# Patient Record
Sex: Male | Born: 1980 | Race: White | Hispanic: No | Marital: Married | Smoking: Never smoker
Health system: Southern US, Community
[De-identification: ages and names within clinical notes are randomized; demographics above are authoritative.]

---

## 2008-12-16 ENCOUNTER — Encounter: Admission: RE | Admit: 2008-12-16 | Discharge: 2008-12-16 | Payer: Self-pay | Admitting: Occupational Medicine

## 2009-05-11 ENCOUNTER — Emergency Department (HOSPITAL_COMMUNITY): Admission: EM | Admit: 2009-05-11 | Discharge: 2009-05-11 | Payer: Self-pay | Admitting: Emergency Medicine

## 2009-05-14 ENCOUNTER — Ambulatory Visit: Payer: Self-pay | Admitting: Family Medicine

## 2009-05-14 ENCOUNTER — Encounter: Payer: Self-pay | Admitting: Sports Medicine

## 2009-05-14 DIAGNOSIS — S43006A Unspecified dislocation of unspecified shoulder joint, initial encounter: Secondary | ICD-10-CM | POA: Insufficient documentation

## 2009-05-20 ENCOUNTER — Encounter: Admission: RE | Admit: 2009-05-20 | Discharge: 2009-08-16 | Payer: Self-pay | Admitting: Family Medicine

## 2009-12-06 ENCOUNTER — Ambulatory Visit: Payer: Self-pay | Admitting: Occupational Medicine

## 2010-10-04 NOTE — Assessment & Plan Note (Signed)
Summary: HIGH BLOOD PRESSURE?  History of Present Illness History of Present Illness: Very pleasant 30 YO WM.  Presents with complaints of HTN.   Says he has been measuring his BP at home and has had high readings for the lat 6-10 months.   Denies any chest pain, vision changes.  Says that occasionally he "does not feel right" and has some pain in his occipital region.   Blood pressure at a friend's hous one time was 190/??.   In talking with him, it sounds like the BP was taken with a cuff that was too small.   BP in the office today was 131/84 using a large adult cuff.  Repeat BP was 140/87.   Victor Lee does admit to snoring loud on occasion and had a neck circumderence of 17.5 inches.   Denies daytiem sleepiness.  Sleeps 6 hours per night.    Physical Exam Chest/Lungs: no rales, wheezes, or rhonchi bilateral, breath sounds equal without effort Heart: regular rate and  rhythm, no murmur Assessment New Problems: HYPERTENSION (ICD-401.1)   Plan New Orders: No Charge Patient Arrived (NCPA0) [NCPA0] Planning Comments:   Discussed monitoring BP with appropriate sized cuff Discussed ris of sleep apnea and the association with HTN He will follow up with Dr. Linford Arnold as needed.  I do not recommend HTn MEDICAIONS at this time.    The patient and/or caregiver has been counseled thoroughly with regard to medications prescribed including dosage, schedule, interactions, rationale for use, and possible side effects and they verbalize understanding.  Diagnoses and expected course of recovery discussed and will return if not improved as expected or if the condition worsens. Patient and/or caregiver verbalized understanding.

## 2011-11-01 ENCOUNTER — Emergency Department (HOSPITAL_COMMUNITY)
Admission: EM | Admit: 2011-11-01 | Discharge: 2011-11-01 | Disposition: A | Discharge status: Home / Self Care | Payer: 59 | Attending: Emergency Medicine | Admitting: Emergency Medicine

## 2011-11-01 ENCOUNTER — Encounter (HOSPITAL_COMMUNITY): Payer: Self-pay | Admitting: Emergency Medicine

## 2011-11-01 ENCOUNTER — Emergency Department (HOSPITAL_COMMUNITY): Payer: 59

## 2011-11-01 DIAGNOSIS — N201 Calculus of ureter: Secondary | ICD-10-CM | POA: Insufficient documentation

## 2011-11-01 DIAGNOSIS — R109 Unspecified abdominal pain: Secondary | ICD-10-CM | POA: Insufficient documentation

## 2011-11-01 DIAGNOSIS — N200 Calculus of kidney: Secondary | ICD-10-CM | POA: Insufficient documentation

## 2011-11-01 LAB — BASIC METABOLIC PANEL
CO2: 25 mEq/L (ref 19–32)
Calcium: 10.5 mg/dL (ref 8.4–10.5)
Chloride: 103 mEq/L (ref 96–112)
GFR calc Af Amer: >90 mL/min (ref >90)
Sodium: 139 mEq/L (ref 135–145)

## 2011-11-01 LAB — CBC
HCT: 42.8 % (ref 39.0–52.0)
MCV: 84.1 fL (ref 78.0–100.0)
Platelets: 396 10*3/uL (ref 150–400)
RBC: 5.09 MIL/uL (ref 4.22–5.81)
WBC: 10.2 10*3/uL (ref 4.0–10.5)

## 2011-11-01 LAB — URINALYSIS, MICROSCOPIC ONLY
Bilirubin Urine: NEGATIVE
Nitrite: NEGATIVE
Specific Gravity, Urine: 1.025 (ref 1.005–1.030)
pH: 6 (ref 5.0–8.0)

## 2011-11-01 MED ORDER — OXYCODONE-ACETAMINOPHEN 5-325 MG PO TABS: 1.0000 | ORAL_TABLET | ORAL | Status: AC | PRN

## 2011-11-01 MED ORDER — OXYCODONE-ACETAMINOPHEN 5-325 MG PO TABS: 1.0000 | ORAL_TABLET | ORAL | Status: DC | PRN

## 2011-11-01 MED ORDER — HYDROMORPHONE HCL PF 1 MG/ML IJ SOLN
1.0000 mg | Freq: Once | INTRAMUSCULAR | Status: AC
  Administered 2011-11-01: 1 mg via INTRAVENOUS
  Filled 2011-11-01: qty 1

## 2011-11-01 MED ORDER — ONDANSETRON HCL 4 MG/2ML IJ SOLN
4.0000 mg | Freq: Once | INTRAMUSCULAR | Status: AC
  Administered 2011-11-01: 4 mg via INTRAVENOUS
  Filled 2011-11-01: qty 2

## 2011-11-01 MED ORDER — ONDANSETRON HCL 4 MG PO TABS: 4.0000 mg | ORAL_TABLET | Freq: Four times a day (QID) | ORAL | Status: AC

## 2011-11-01 MED ORDER — KETOROLAC TROMETHAMINE 30 MG/ML IJ SOLN
30.0000 mg | Freq: Once | INTRAMUSCULAR | Status: AC
  Administered 2011-11-01: 30 mg via INTRAVENOUS
  Filled 2011-11-01: qty 1

## 2011-11-01 MED ORDER — SODIUM CHLORIDE 0.9 % IV BOLUS (SEPSIS)
1000.0000 mL | Freq: Once | INTRAVENOUS | Status: AC
  Administered 2011-11-01: 1000 mL via INTRAVENOUS

## 2011-11-01 NOTE — Discharge Instructions (Signed)
Kidney Stones Kidney stones (ureteral lithiasis) are deposits that form inside your kidneys. The intense pain is caused by the stone moving through the urinary tract. When the stone moves, the ureter goes into spasm around the stone. The stone is usually passed in the urine.  CAUSES   A disorder that makes certain neck glands produce too much parathyroid hormone (primary hyperparathyroidism).   A buildup of uric acid crystals.   Narrowing (stricture) of the ureter.   A kidney obstruction present at birth (congenital obstruction).   Previous surgery on the kidney or ureters.   Numerous kidney infections.  SYMPTOMS   Feeling sick to your stomach (nauseous).   Throwing up (vomiting).   Blood in the urine (hematuria).   Pain that usually spreads (radiates) to the groin.   Frequency or urgency of urination.  DIAGNOSIS   Taking a history and physical exam.   Blood or urine tests.   Computerized X-ray scan (CT scan).   Occasionally, an examination of the inside of the urinary bladder (cystoscopy) is performed.  TREATMENT   Observation.   Increasing your fluid intake.   Surgery may be needed if you have severe pain or persistent obstruction.  The size, location, and chemical composition are all important variables that will determine the proper choice of action for you. Talk to your caregiver to better understand your situation so that you will minimize the risk of injury to yourself and your kidney.  HOME CARE INSTRUCTIONS   Drink enough water and fluids to keep your urine clear or pale yellow.   Strain all urine through the provided strainer. Keep all particulate matter and stones for your caregiver to see. The stone causing the pain may be as small as a grain of salt. It is very important to use the strainer each and every time you pass your urine. The collection of your stone will allow your caregiver to analyze it and verify that a stone has actually passed.   Only take  over-the-counter or prescription medicines for pain, discomfort, or fever as directed by your caregiver.   Make a follow-up appointment with your caregiver as directed.   Get follow-up X-rays if required. The absence of pain does not always mean that the stone has passed. It may have only stopped moving. If the urine remains completely obstructed, it can cause loss of kidney function or even complete destruction of the kidney. It is your responsibility to make sure X-rays and follow-ups are completed. Ultrasounds of the kidney can show blockages and the status of the kidney. Ultrasounds are not associated with any radiation and can be performed easily in a matter of minutes.  SEEK IMMEDIATE MEDICAL CARE IF:   Pain cannot be controlled with the prescribed medicine.   You have a fever.   The severity or intensity of pain increases over 18 hours and is not relieved by pain medicine.   You develop a new onset of abdominal pain.   You feel faint or pass out.  MAKE SURE YOU:   Understand these instructions.   Will watch your condition.   Will get help right away if you are not doing well or get worse.  Document Released: 08/21/2005 Document Revised: 05/03/2011 Document Reviewed: 12/17/2009 [facility] Patient Information 2012 [place], [place].     Acetaminophen; Oxycodone tablets What is this medicine? ACETAMINOPHEN; OXYCODONE (a set a MEE noe fen; ox i KOE done) is a pain reliever. It is used to treat mild to moderate pain. This medicine  may be used for other purposes; ask your health care provider or pharmacist if you have questions. What should I tell my health care provider before I take this medicine? They need to know if you have any of these conditions: -brain tumor -Crohn's disease, inflammatory bowel disease, or ulcerative colitis -drink more than 3 alcohol containing drinks per day -drug abuse or addiction -head injury -heart or circulation problems -kidney disease or  problems going to the bathroom -liver disease -lung disease, asthma, or breathing problems -an unusual or allergic reaction to acetaminophen, oxycodone, other opioid analgesics, other medicines, foods, dyes, or preservatives -pregnant or trying to get pregnant -breast-feeding How should I use this medicine? Take this medicine by mouth with a full glass of water. Follow the directions on the prescription label. Take your medicine at regular intervals. Do not take your medicine more often than directed. Talk to your pediatrician regarding the use of this medicine in children. Special care may be needed. Patients over 85 years old may have a stronger reaction and need a smaller dose. Overdosage: If you think you have taken too much of this medicine contact a poison control center or emergency room at once. NOTE: This medicine is only for you. Do not share this medicine with others. What if I miss a dose? If you miss a dose, take it as soon as you can. If it is almost time for your next dose, take only that dose. Do not take double or extra doses. What may interact with this medicine? -alcohol or medicines that contain alcohol -antihistamines -barbiturates like amobarbital, butalbital, butabarbital, methohexital, pentobarbital, phenobarbital, thiopental, and secobarbital -benztropine -drugs for bladder problems like solifenacin, trospium, oxybutynin, tolterodine, hyoscyamine, and methscopolamine -drugs for breathing problems like ipratropium and tiotropium -drugs for certain stomach or intestine problems like propantheline, homatropine methylbromide, glycopyrrolate, atropine, belladonna, and dicyclomine -general anesthetics like etomidate, ketamine, nitrous oxide, propofol, desflurane, enflurane, halothane, isoflurane, and sevoflurane -medicines for depression, anxiety, or psychotic disturbances -medicines for pain like codeine, morphine, pentazocine, buprenorphine, butorphanol, nalbuphine,  tramadol, and propoxyphene -medicines for sleep -muscle relaxants -naltrexone -phenothiazines like perphenazine, thioridazine, chlorpromazine, mesoridazine, fluphenazine, prochlorperazine, promazine, and trifluoperazine -scopolamine -trihexyphenidyl This list may not describe all possible interactions. Give your health care provider a list of all the medicines, herbs, non-prescription drugs, or dietary supplements you use. Also tell them if you smoke, drink alcohol, or use illegal drugs. Some items may interact with your medicine. What should I watch for while using this medicine? Tell your doctor or health care professional if your pain does not go away, if it gets worse, or if you have new or a different type of pain. You may develop tolerance to the medicine. Tolerance means that you will need a higher dose of the medication for pain relief. Tolerance is normal and is expected if you take this medicine for a long time. Do not suddenly stop taking your medicine because you may develop a severe reaction. Your body becomes used to the medicine. This does NOT mean you are addicted. Addiction is a behavior related to getting and using a drug for a nonmedical reason. If you have pain, you have a medical reason to take pain medicine. Your doctor will tell you how much medicine to take. If your doctor wants you to stop the medicine, the dose will be slowly lowered over time to avoid any side effects. You may get drowsy or dizzy. Do not drive, use machinery, or do anything that needs mental alertness until you  know how this medicine affects you. Do not stand or sit up quickly, especially if you are an older patient. This reduces the risk of dizzy or fainting spells. Alcohol may interfere with the effect of this medicine. Avoid alcoholic drinks. The medicine will cause constipation. Try to have a bowel movement at least every 2 to 3 days. If you do not have a bowel movement for 3 days, call your doctor or  health care professional. Do not take Tylenol (acetaminophen) or medicines that have acetaminophen with this medicine. Too much acetaminophen can be very dangerous. Many nonprescription medicines contain acetaminophen. Always read the labels carefully to avoid taking more acetaminophen. What side effects may I notice from receiving this medicine? Side effects that you should report to your doctor or health care professional as soon as possible: -allergic reactions like skin rash, itching or hives, swelling of the face, lips, or tongue -breathing difficulties, wheezing -confusion -light headedness or fainting spells -severe stomach pain -yellowing of the skin or the whites of the eyes Side effects that usually do not require medical attention (report to your doctor or health care professional if they continue or are bothersome): -dizziness -drowsiness -nausea -vomiting This list may not describe all possible side effects. Call your doctor for medical advice about side effects. You may report side effects to FDA at 1-800-FDA-1088. Where should I keep my medicine? Keep out of the reach of children. This medicine can be abused. Keep your medicine in a safe place to protect it from theft. Do not share this medicine with anyone. Selling or giving away this medicine is dangerous and against the law. Store at room temperature between 20 and 25 degrees C (68 and 77 degrees F). Keep container tightly closed. Protect from light. Flush any unused medicines down the toilet. Do not use the medicine after the expiration date. NOTE: This sheet is a summary. It may not cover all possible information. If you have questions about this medicine, talk to your doctor, pharmacist, or health care provider.  2012, [employer]. (07/20/2008 10:01:21 AM)

## 2011-11-01 NOTE — ED Provider Notes (Signed)
History     CSN: [id number]  Arrival date & time 11/01/11  0126   First MD Initiated Contact with Patient 11/01/11 0201      Chief Complaint  Patient presents with  . Flank Pain    right   and began having right flank pain on Sunday evening. He states it has been intermittent spike and this evening became more severe and began radiating to the right groin. This is associated with nausea, but no vomiting. He denies any fevers. He has had no hematuria or dysuria. He did take a Tylenol Sunday, but has not taken any pain medication. Since then. He has not had this pain in the past. There is no specific alleviating or aggravating factors. No previous abdominal surgeries. No trauma. No recent illness  (Consider location/radiation/quality/duration/timing/severity/associated sxs/prior treatment) HPI  History reviewed. No pertinent past medical history.  History reviewed. No pertinent past surgical history.  No family history on file.  History  Substance Use Topics  . Smoking status: Never Smoker   . Smokeless tobacco: Not on file  . Alcohol Use: No      Review of Systems  All other systems reviewed and are negative.    Allergies  Review of patient's allergies indicates no known allergies.  Home Medications  No current outpatient prescriptions on file.  BP 164/91  Pulse 93  Resp 16  Wt 250 lb (113.399 kg)  SpO2 99%  Physical Exam  Nursing note and vitals reviewed. Constitutional: He is oriented to person, place, and time. He appears well-developed and well-nourished.  HENT:  Head: Normocephalic and atraumatic.  Eyes: Conjunctivae and EOM are normal. Pupils are equal, round, and reactive to light.  Neck: Neck supple.  Cardiovascular: Normal rate and regular rhythm.  Exam reveals no gallop and no friction rub.   No murmur heard. Pulmonary/Chest: Breath sounds normal. He has no wheezes. He has no rales. He exhibits no tenderness.  Abdominal: Soft. Bowel sounds are  normal. He exhibits no distension. There is no tenderness. There is no rebound and no guarding.  Genitourinary:       Normal external genitalia, no testicular tenderness. Minimal right CVA tenderness.  Musculoskeletal: Normal range of motion.  Neurological: He is alert and oriented to person, place, and time. No cranial nerve deficit. Coordination normal.  Skin: Skin is warm and dry. No rash noted.  Psychiatric: He has a normal mood and affect.    ED Course  Procedures (including critical care time)   Labs Reviewed  BASIC METABOLIC PANEL  CBC  URINALYSIS, WITH MICROSCOPIC   No results found.   No diagnosis found.    MDM  Pt is seen and examined;  Initial history and physical completed.  Will follow.      Results for orders placed during the hospital encounter of 11/01/11  BASIC METABOLIC PANEL      Component Value Range   Sodium 139  135 - 145 (mEq/L)   Potassium 3.8  3.5 - 5.1 (mEq/L)   Chloride 103  96 - 112 (mEq/L)   CO2 25  19 - 32 (mEq/L)   Glucose, Bld 109 (*) 70 - 99 (mg/dL)   BUN 15  6 - 23 (mg/dL)   Creatinine, Ser 4.09  0.50 - 1.35 (mg/dL)   Calcium 81.1  8.4 - 10.5 (mg/dL)   GFR calc non Af Amer 84 (*) >90 (mL/min)   GFR calc Af Amer >90  >90 (mL/min)  CBC      Component Value  Range   WBC 10.2  4.0 - 10.5 (K/uL)   RBC 5.09  4.22 - 5.81 (MIL/uL)   Hemoglobin 14.6  13.0 - 17.0 (g/dL)   HCT 91.4  78.2 - 95.6 (%)   MCV 84.1  78.0 - 100.0 (fL)   MCH 28.7  26.0 - 34.0 (pg)   MCHC 34.1  30.0 - 36.0 (g/dL)   RDW 21.3  08.6 - 57.8 (%)   Platelets 396  150 - 400 (K/uL)  URINALYSIS, WITH MICROSCOPIC      Component Value Range   Color, Urine YELLOW  YELLOW    APPearance CLEAR  CLEAR    Specific Gravity, Urine 1.025  1.005 - 1.030    pH 6.0  5.0 - 8.0    Glucose, UA NEGATIVE  NEGATIVE (mg/dL)   Hgb urine dipstick TRACE (*) NEGATIVE    Bilirubin Urine NEGATIVE  NEGATIVE    Ketones, ur NEGATIVE  NEGATIVE (mg/dL)   Protein, ur NEGATIVE  NEGATIVE (mg/dL)    Urobilinogen, UA 0.2  0.0 - 1.0 (mg/dL)   Nitrite NEGATIVE  NEGATIVE    Leukocytes, UA NEGATIVE  NEGATIVE    WBC, UA 0-2  <3 (WBC/hpf)   RBC / HPF 3-6  <3 (RBC/hpf)   Ct Abdomen Pelvis Wo Contrast  11/01/2011  *RADIOLOGY REPORT*  Clinical Data: Right flank pain and microhematuria.  CT ABDOMEN AND PELVIS WITHOUT CONTRAST  Technique:  Multidetector CT imaging of the abdomen and pelvis was performed following the standard protocol without intravenous contrast.  Comparison: None.  Findings: The visualized lung bases are clear.  The liver and spleen are unremarkable in appearance.  The gallbladder is within normal limits.  The pancreas and adrenal glands are unremarkable.  There is minimal right-sided hydronephrosis, with mild prominence of the right ureter to the level of an obstructing distal stone just above the right vesicoureteral junction, measuring 3 mm. Scattered nonobstructing bilateral renal stones are seen, measuring up to 3 mm in size.  The kidneys are otherwise unremarkable in appearance.  Minimal nonspecific left-sided perinephric stranding is noted.  No free fluid is identified.  The small bowel is unremarkable in appearance.  The stomach is within normal limits.  No acute vascular abnormalities are seen.  The appendix is normal in caliber and contains air, without evidence for appendicitis.  The colon is unremarkable in appearance.  The bladder is mildly distended and grossly unremarkable in appearance.  The prostate is normal in size.  No inguinal lymphadenopathy is seen; scattered inguinal nodes remain borderline normal in size.  No acute osseous abnormalities are identified.  IMPRESSION:  1.  Minimal right-sided hydronephrosis, with an obstructing distal ureteral stone just above the right vesicoureteral junction, measuring 3 mm. 2.  Scattered nonobstructing bilateral renal stones, measuring up to 3 mm in size.  Original Report Authenticated By: Tonia Ghent, M.D.    Patient is reassessed in  the room, feeling better after initial treatment. CT scan did reveal a 3 mm stone at the right vesico- ureteral junction. Patient is amenable to going home him was given outpatient urology referral. He is given prescriptions for Percocet and Zofran. He is also requesting a work note. Patient was told to return to the ED for any concerns or changing symptoms   Piotr Christopher A. Patrica Duel, MD 11/01/11 [phone]

## 2011-11-01 NOTE — ED Notes (Signed)
Pt ambulated 1/4 of the ER without difficulty.

## 2011-11-01 NOTE — ED Notes (Signed)
Pt reports having right side flank pain since this past Sunday. States that pain has gotten increasingly worse 10/10. Reports nausea with no vomiting at this time.

## 2011-11-01 NOTE — ED Notes (Signed)
Pt alert, c/o right flank pain, onset a few days ago, worse today resp even unlabored, skin pwd

## 2011-11-01 NOTE — ED Notes (Signed)
Patient transported to CT 

## 2013-06-09 IMAGING — CT CT ABD-PELV W/O CM
1 series · 15 of 28 positions shown, 19 images · non-contrast
Comparison: None.

CLINICAL DATA: Right flank pain and microhematuria.

CT ABDOMEN AND PELVIS WITHOUT CONTRAST
TECHNIQUE: Multidetector CT imaging of the abdomen and pelvis was
performed following the standard protocol without intravenous
contrast.

[Series 4: lung · axial · 0.78mm/px · z∈[+1392,+1512]mm · 15 of 28 slices shown, 19 images]
[im 3/28  soft-tissue]
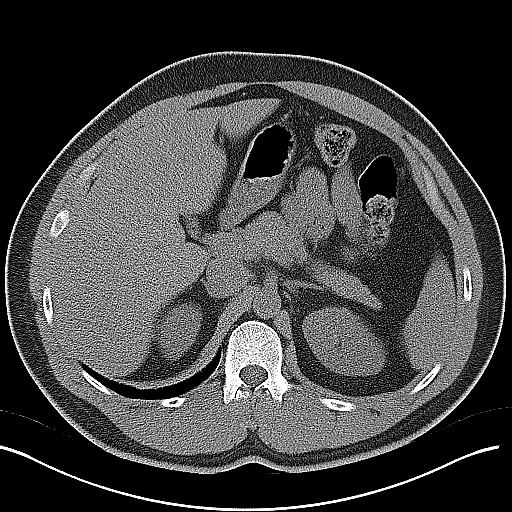
[im 3/28  bone]
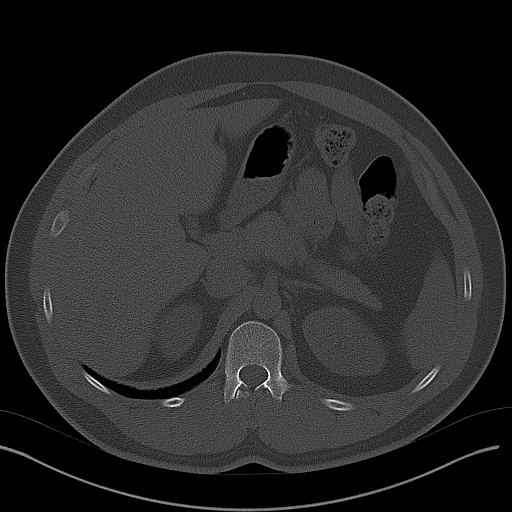
[im 5/28  soft-tissue]
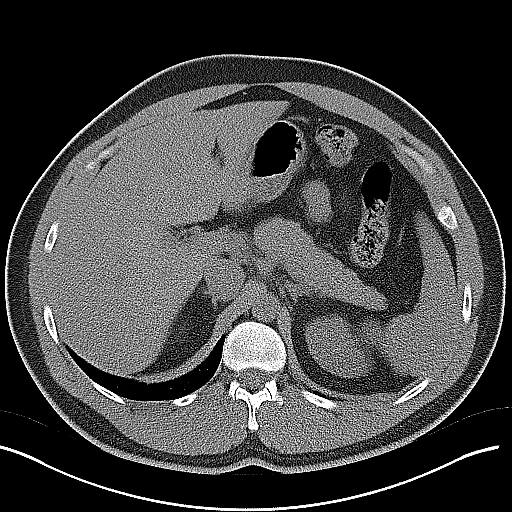
[im 7/28  soft-tissue]
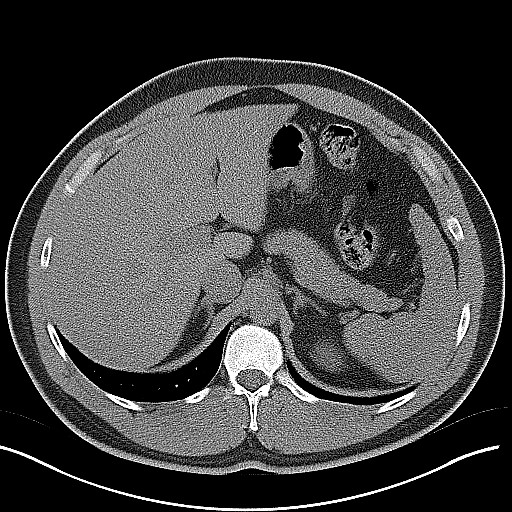
[im 9/28  soft-tissue]
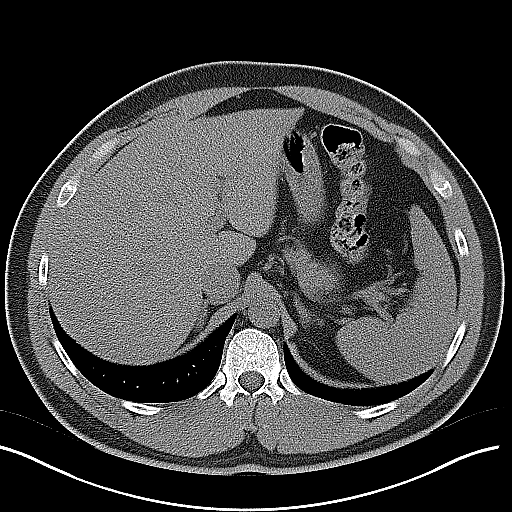
[im 11/28  soft-tissue]
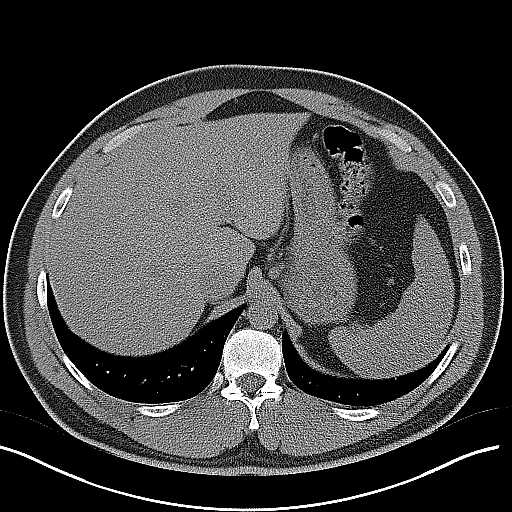
[im 13/28  soft-tissue]
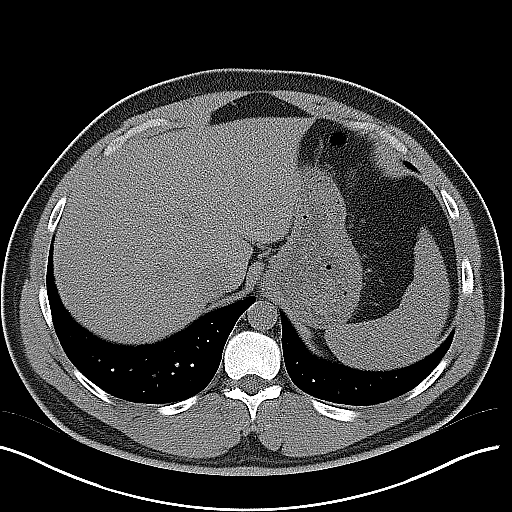
[im 15/28  soft-tissue]
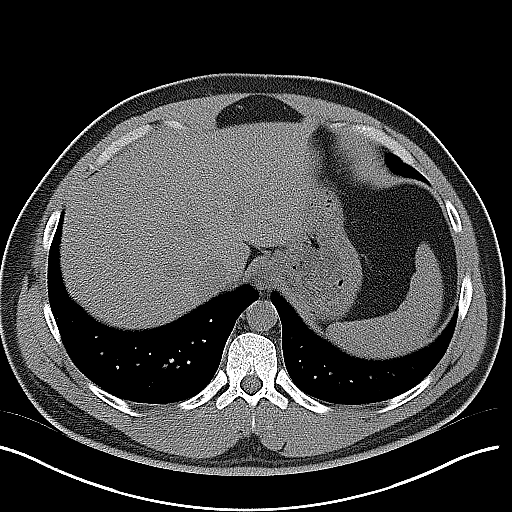
[im 17/28  soft-tissue]
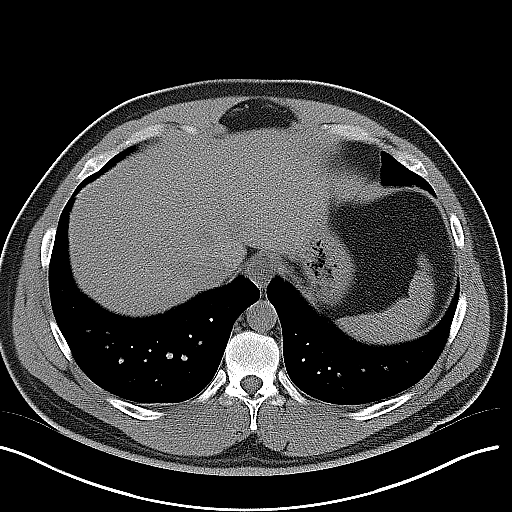
[im 19/28  soft-tissue]
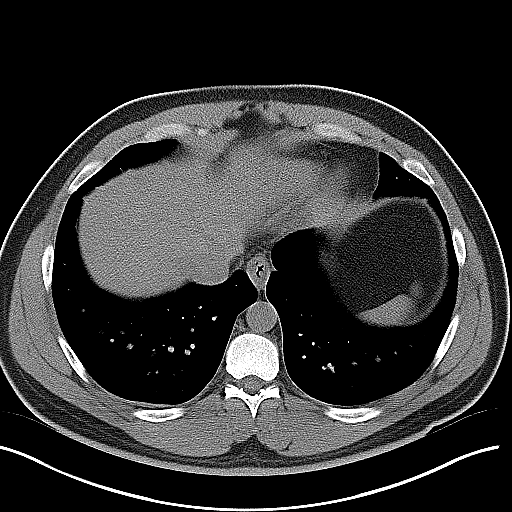
[im 19/28  bone]
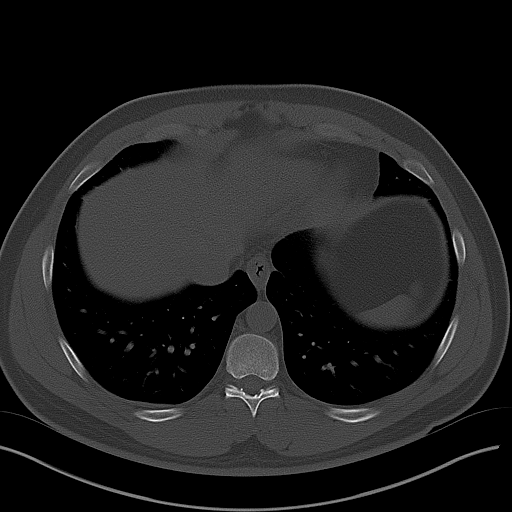
[im 21/28  soft-tissue]
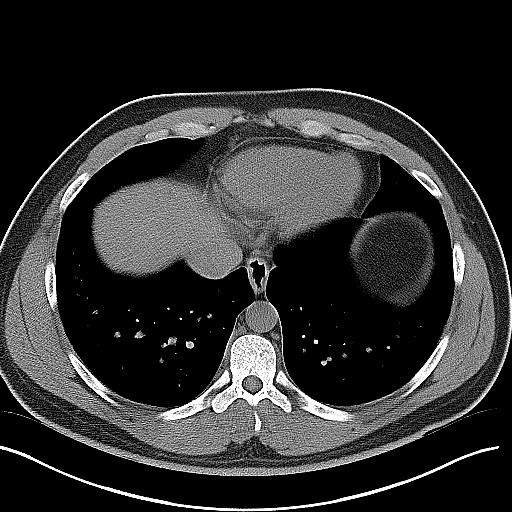
[im 23/28  soft-tissue]
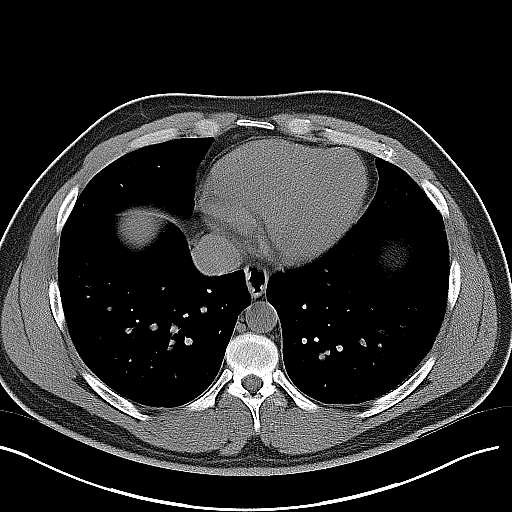
[im 24/28  lung]
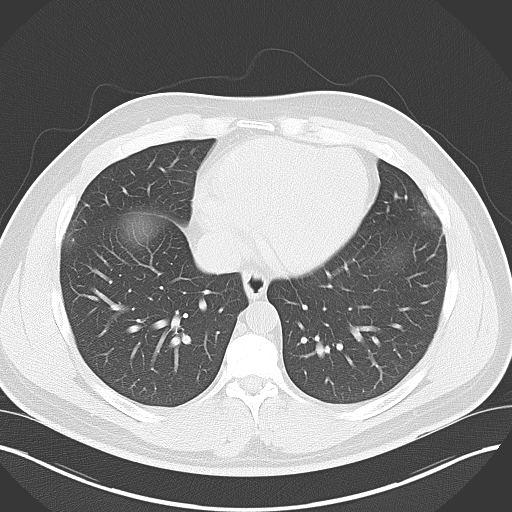
[im 25/28  soft-tissue]
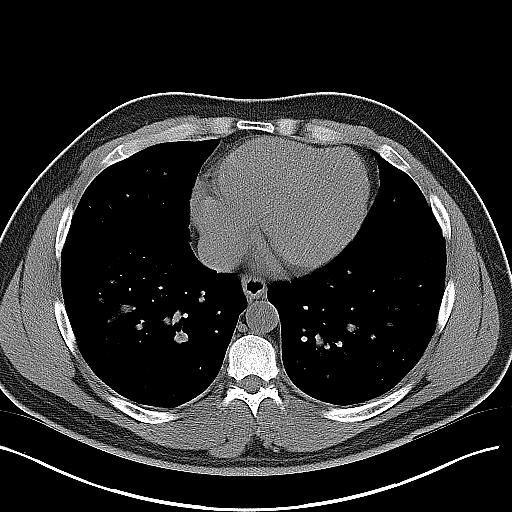
[im 25/28  lung]
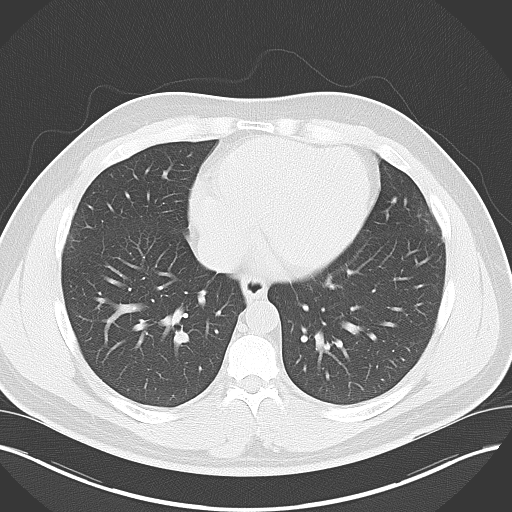
[im 26/28  lung]
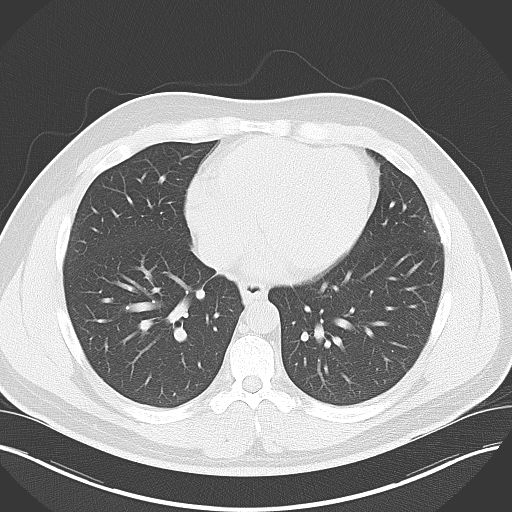
[im 27/28  soft-tissue]
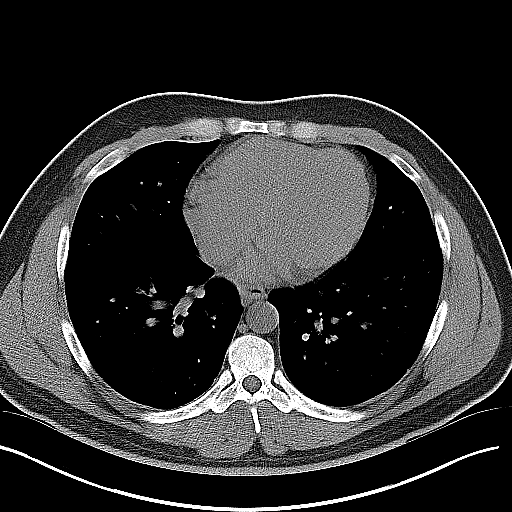
[im 27/28  lung]
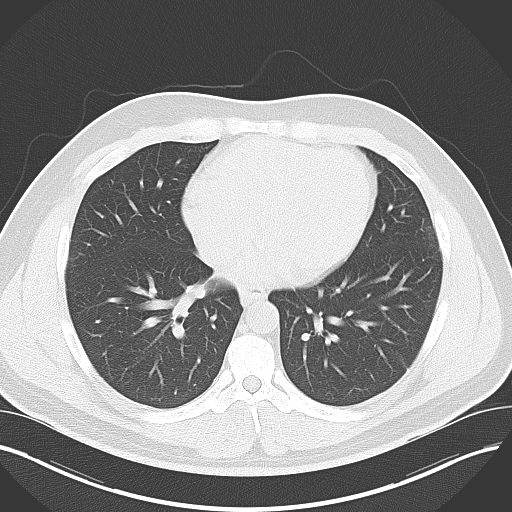

[15 of 28 positions shown; findings below may reference images not displayed]

FINDINGS: The visualized lung bases are clear.

The liver and spleen are unremarkable in appearance.  The
gallbladder is within normal limits.  The pancreas and adrenal
glands are unremarkable.

There is minimal right-sided hydronephrosis, with mild prominence
of the right ureter to the level of an obstructing distal stone
just above the right vesicoureteral junction, measuring 3 mm.
Scattered nonobstructing bilateral renal stones are seen, measuring
up to 3 mm in size.

The kidneys are otherwise unremarkable in appearance.  Minimal
nonspecific left-sided perinephric stranding is noted.

No free fluid is identified.  The small bowel is unremarkable in
appearance.  The stomach is within normal limits.  No acute
vascular abnormalities are seen.

The appendix is normal in caliber and contains air, without
evidence for appendicitis.  The colon is unremarkable in
appearance.

The bladder is mildly distended and grossly unremarkable in
appearance.  The prostate is normal in size.  No inguinal
lymphadenopathy is seen; scattered inguinal nodes remain borderline
normal in size.

No acute osseous abnormalities are identified.
IMPRESSION: 1.  Minimal right-sided hydronephrosis, with an obstructing distal
ureteral stone just above the right vesicoureteral junction,
measuring 3 mm.
2.  Scattered nonobstructing bilateral renal stones, measuring up
to 3 mm in size.

## 2014-04-07 ENCOUNTER — Ambulatory Visit (INDEPENDENT_AMBULATORY_CARE_PROVIDER_SITE_OTHER): Payer: BLUE CROSS/BLUE SHIELD | Admitting: Podiatry

## 2014-04-07 ENCOUNTER — Encounter: Payer: Self-pay | Admitting: Podiatry

## 2014-04-07 ENCOUNTER — Ambulatory Visit (INDEPENDENT_AMBULATORY_CARE_PROVIDER_SITE_OTHER): Payer: PRIVATE HEALTH INSURANCE

## 2014-04-07 VITALS — BP 164/88 | HR 86 | Resp 16 | Ht 73.0 in | Wt 275.0 lb

## 2014-04-07 DIAGNOSIS — M722 Plantar fascial fibromatosis: Secondary | ICD-10-CM

## 2014-04-07 DIAGNOSIS — L608 Other nail disorders: Secondary | ICD-10-CM | POA: Diagnosis not present

## 2014-04-07 MED ORDER — METHYLPREDNISOLONE (PAK) 4 MG PO TABS: ORAL_TABLET | ORAL | Status: DC

## 2014-04-07 MED ORDER — MELOXICAM 15 MG PO TABS: 15.0000 mg | ORAL_TABLET | Freq: Every day | ORAL | Status: DC

## 2014-04-07 NOTE — Progress Notes (Signed)
   Subjective:    Patient ID: Victor Lee, Victor Lee    DOB: 29-Jun-1981, 33 y.o.   MRN: [id number]  HPI Comments: "I have pain in my feet"  Patient c/o aching plantar heels bilateral, left over right, for about 1 year. He does have AM pain. He is active in softball and really notices a lot of pain the morning after games. He has tried Ibuprofen-no help.  Also, he wears steel toe shoes and has thick, discolored toenails 1st bilateral that do become painful at times.  Foot Pain      Review of Systems  All other systems reviewed and are negative.      Objective:   Physical Exam: I have reviewed his past medical history medications allergies surgeries social history and review of systems. Pulses are strongly palpable bilateral. Neurologic sensorium is intact per since once the monofilament. Deep tendon reflexes are intact bilateral and muscle strength is 5 over 5 dorsiflexors plantar flexors inverters everters all intrinsic musculature is intact. Orthopedic evaluation demonstrates all joints distal to the ankle a full range of motion without crepitation. He has pain on palpation medial continued tubercles bilateral. Radiographic evaluation does demonstrate a soft tissue increase in density at the plantar fascial calcaneal insertion site indicative of plantar fasciitis. Cutaneous evaluation demonstrates a well hydrated cutis with exception of tinea pedis to the plantar aspect of the bilateral foot also he has thick yellow dystrophic possibly mycotic nails to the hallux nail plates bilaterally. At the very least they are performed.        Assessment & Plan:  Assessment: Plantar fasciitis bilateral. Nail dystrophy hallux bilateral.  Plan: Discussed etiology pathology conservative versus surgical therapies. Injected the bilateral heels with Kenalog and local anesthetic. He was dispensed a plantar fascial brace bilateral. He was dispensed a single night splint. A prescription was written for a  Medrol Dosepak to be followed by medic. We also took samples of the nail plates sent for pathologic evaluation. We discussed appropriate shoe gear stretching exercises ice therapy and shoe gear modification I will followup with him in the near future.

## 2014-04-07 NOTE — Patient Instructions (Signed)

## 2014-04-08 ENCOUNTER — Encounter: Payer: Self-pay | Admitting: *Deleted

## 2014-04-08 NOTE — Progress Notes (Signed)
Patient ID: Victor Lee, male   DOB: [birth year], 33 y.o.   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   [facility] [address] [place], [place]  [id number] Ph: [phone]

## 2014-04-28 ENCOUNTER — Encounter: Payer: Self-pay | Admitting: Podiatry

## 2014-05-05 ENCOUNTER — Encounter: Payer: Self-pay | Admitting: Podiatry

## 2014-05-05 ENCOUNTER — Ambulatory Visit: Payer: PRIVATE HEALTH INSURANCE | Admitting: Podiatry

## 2014-05-05 ENCOUNTER — Ambulatory Visit (INDEPENDENT_AMBULATORY_CARE_PROVIDER_SITE_OTHER): Payer: BLUE CROSS/BLUE SHIELD | Admitting: Podiatry

## 2014-05-05 VITALS — BP 145/80 | HR 76 | Resp 16

## 2014-05-05 DIAGNOSIS — B351 Tinea unguium: Secondary | ICD-10-CM

## 2014-05-05 DIAGNOSIS — M722 Plantar fascial fibromatosis: Secondary | ICD-10-CM

## 2014-05-05 NOTE — Progress Notes (Signed)
   Subjective:    Patient ID: Victor Lee, male    DOB: 02/17/1981, 33 y.o.   MRN: [id number]  HPI Comments: "Much better. Those braces really helped"  Follow up plantar fasciitis bilateral   Patient currently states that he has no pain in his heels. States that he has been wearing the braces which seem to help relieve the symptoms. He did not take the medication prescribed at the last appointment. Currently his pain is 0/10. No pain with ambulation or after periods of rest. Patient also here for followup evaluation of nail biopsies. No other complaints.   Review of Systems  All other systems reviewed and are negative.      Objective:   Physical Exam AAO x3, NAD DP/PT pulses palpable 2/4 b/l. CRT < 3sec Protect the sensation intact the [facility] monofilament. No pain on the plantar, posterior aspect of the calcaneus or with lateral compression. No pain within the plantar fascia. MMT 5/5, ROM WNL. Hallux nails bilaterally hypertrophic, dystrophic, discolored, brittle. Surrounding erythema or drainage. No clinical signs of infection. No open lesions.      Assessment & Plan:  33 year old male with result plantar fasciitis, onychomycosis. -Various treatment options were discussed with the patient for both conditions which include all alternatives, risks, complications. -At this time continue with the plantar fascial braces. We'll hold off on another steroid injection as he currently has no complaints. -Nail biopsy results reviewed with the patient which revealed onychomycosis. There is treatment options both over-the-counter, prescribed topical, oral medications discussed in detail. Patient states that he'll most likely be noncompliant with the therapy so therefore he will elect to proceed with over-the-counter therapy at this time. If he does not see any resolution over the next several months he will consider a prescription medication. -Followup as needed or if there are any  change in symptoms questions or concerns.

## 2014-05-05 NOTE — Patient Instructions (Signed)
Over the counter treatment for nail fungus...Marland KitchenMarland KitchenFungi-nail

## 2017-09-20 ENCOUNTER — Encounter: Payer: Self-pay | Admitting: Sports Medicine

## 2017-09-20 ENCOUNTER — Ambulatory Visit (INDEPENDENT_AMBULATORY_CARE_PROVIDER_SITE_OTHER): Payer: BLUE CROSS/BLUE SHIELD | Admitting: Sports Medicine

## 2017-09-20 VITALS — BP 125/75 | HR 88 | Resp 18 | Ht 73.0 in | Wt 271.0 lb

## 2017-09-20 DIAGNOSIS — E7841 Elevated Lipoprotein(a): Secondary | ICD-10-CM

## 2017-09-20 DIAGNOSIS — H5711 Ocular pain, right eye: Secondary | ICD-10-CM

## 2017-09-20 DIAGNOSIS — Z0000 Encounter for general adult medical examination without abnormal findings: Secondary | ICD-10-CM | POA: Diagnosis not present

## 2017-09-20 DIAGNOSIS — R29898 Other symptoms and signs involving the musculoskeletal system: Secondary | ICD-10-CM | POA: Insufficient documentation

## 2017-09-20 DIAGNOSIS — H6121 Impacted cerumen, right ear: Secondary | ICD-10-CM

## 2017-09-20 DIAGNOSIS — Z23 Encounter for immunization: Secondary | ICD-10-CM

## 2017-09-20 MED ORDER — CARBAMIDE PEROXIDE 6.5 % OT SOLN: 5.0000 [drp] | Freq: Two times a day (BID) | OTIC | 0 refills | Status: DC

## 2017-09-20 NOTE — Assessment & Plan Note (Signed)
Patient will let me know if he would like a referral, I do think a custom mouthguard worn at night would probably provide some relief.

## 2017-09-20 NOTE — Assessment & Plan Note (Signed)
Checking routine labs. Tetanus vaccine given today, declines flu shot.

## 2017-09-20 NOTE — Assessment & Plan Note (Signed)
Referral to optometry for further evaluation. Exam is benign, vision is normal.

## 2017-09-20 NOTE — Patient Instructions (Signed)
Carbamide Peroxide ear solution  What is this medicine?  CARBAMIDE PEROXIDE (CAR bah mide per OX ide) is used to soften and help remove ear wax.  This medicine may be used for other purposes; ask your health care provider or pharmacist if you have questions.  COMMON BRAND NAME(S): Auro Ear, Auro Earache Relief, Debrox, Ear Drops, Ear Wax Removal, Ear Wax Remover, Earwax Treatment, Murine, Thera-Ear  What should I tell my health care provider before I take this medicine?  They need to know if you have any of these conditions:  -dizziness  -ear discharge  -ear pain, irritation or rash  -infection  -perforated eardrum (hole in eardrum)  -an unusual or allergic reaction to carbamide peroxide, glycerin, hydrogen peroxide, other medicines, foods, dyes, or preservatives  -pregnant or trying to get pregnant  -breast-feeding  How should I use this medicine?  This medicine is only for use in the outer ear canal. Follow the directions carefully. Wash hands before and after use. The solution may be warmed by holding the bottle in the hand for 1 to 2 minutes. Lie with the affected ear facing upward. Place the proper number of drops into the ear canal. After the drops are instilled, remain lying with the affected ear upward for 5 minutes to help the drops stay in the ear canal. A cotton ball may be gently inserted at the ear opening for no longer than 5 to 10 minutes to ensure retention. Repeat, if necessary, for the opposite ear. Do not touch the tip of the dropper to the ear, fingertips, or other surface. Do not rinse the dropper after use. Keep container tightly closed.  Talk to your pediatrician regarding the use of this medicine in children. While this drug may be used in children as young as 12 years for selected conditions, precautions do apply.  Overdosage: If you think you have taken too much of this medicine contact a poison control center or emergency room at once.   NOTE: This medicine is only for you. Do not share this medicine with others.  What if I miss a dose?  If you miss a dose, use it as soon as you can. If it is almost time for your next dose, use only that dose. Do not use double or extra doses.  What may interact with this medicine?  Interactions are not expected. Do not use any other ear products without asking your doctor or health care professional.  This list may not describe all possible interactions. Give your health care provider a list of all the medicines, herbs, non-prescription drugs, or dietary supplements you use. Also tell them if you smoke, drink alcohol, or use illegal drugs. Some items may interact with your medicine.  What should I watch for while using this medicine?  This medicine is not for long-term use. Do not use for more than 4 days without checking with your health care professional. Contact your doctor or health care professional if your condition does not start to get better within a few days or if you notice burning, redness, itching or swelling.  What side effects may I notice from receiving this medicine?  Side effects that you should report to your doctor or health care professional as soon as possible:  -allergic reactions like skin rash, itching or hives, swelling of the face, lips, or tongue  -burning, itching, and redness  -worsening ear pain  -rash  Side effects that usually do not require medical attention (report to your doctor   or health care professional if they continue or are bothersome):  -abnormal sensation while putting the drops in the ear  -temporary reduction in hearing (but not complete loss of hearing)  This list may not describe all possible side effects. Call your doctor for medical advice about side effects. You may report side effects to FDA at 1-800-FDA-1088.  Where should I keep my medicine?  Keep out of the reach of children.  Store at room temperature between 15 and 30 degrees C (59 and 86 degrees  F) in a tight, light-resistant container. Keep bottle away from excessive heat and direct sunlight. Throw away any unused medicine after the expiration date.  NOTE: This sheet is a summary. It may not cover all possible information. If you have questions about this medicine, talk to your doctor, pharmacist, or health care provider.  © 2018 Elsevier/Gold Standard (2007-12-03 14:00:02)

## 2017-09-20 NOTE — Progress Notes (Addendum)
Subjective:    CC: Establish care.   HPI:  This is a pleasant 37 year old male, he is healthy, he declines flu shot and is due for a tetanus shot.  He is also due for some routine blood work.  He has been complaining about pain above his right eye, worse in the mornings and seemingly better when he takes a shower, no visual changes, pain is intermittent, no pain with extraocular movements.  No nasal discharge, no trauma, no triggers that he can recall, symptoms have been present for a few months now.  Jaw clicking: Present for years as well, by a soccer ball years ago and then symptoms began.  Minimally tender and painful.  I reviewed the past medical history, family history, social history, surgical history, and allergies today and no changes were needed.  Please see the problem list section below in epic for further details.  Past Medical History: No past medical history on file. Past Surgical History: No past surgical history on file. Social History: Social History   Socioeconomic History  . Marital status: Married    Spouse name: Clifton JamesKelly Alaimo  . Number of children: 3  . Years of education: 3214  . Highest education level: Associate degree: occupational, Scientist, product/process developmenttechnical, or vocational program  Social Needs  . Financial resource strain: None  . Food insecurity - worry: None  . Food insecurity - inability: None  . Transportation needs - medical: None  . Transportation needs - non-medical: None  Occupational History  . None  Tobacco Use  . Smoking status: Never Smoker  . Smokeless tobacco: Never Used  Substance and Sexual Activity  . Alcohol use: No  . Drug use: No  . Sexual activity: Yes    Partners: Female  Other Topics Concern  . None  Social History Narrative  . None   Family History: Family History  Problem Relation Age of Onset  . Heart attack Father   . High Cholesterol Father   . High blood pressure Father   . Cancer - Other Maternal Grandfather     Allergies: No Known Allergies Medications: See med rec.  Review of Systems: No headache, visual changes, nausea, vomiting, diarrhea, constipation, dizziness, abdominal pain, skin rash, fevers, chills, night sweats, swollen lymph nodes, weight loss, chest pain, body aches, joint swelling, muscle aches, shortness of breath, mood changes, visual or auditory hallucinations.  Objective:    General: Well Developed, well nourished, and in no acute distress.  Neuro: Alert and oriented x3, extra-ocular muscles intact, sensation grossly intact.  HEENT: Normocephalic, atraumatic, pupils equal round reactive to light, neck supple, no masses, no lymphadenopathy, thyroid nonpalpable.  Cerumen impaction on the right. Skin: Warm and dry, no rashes noted.  Cardiac: Regular rate and rhythm, no murmurs rubs or gallops.  Respiratory: Clear to auscultation bilaterally. Not using accessory muscles, speaking in full sentences.  Abdominal: Soft, nontender, nondistended, positive bowel sounds, no masses, no organomegaly.  Musculoskeletal: Shoulder, elbow, wrist, hip, knee, ankle stable, and with full range of motion.  Indication: Cerumen impaction of the ear(s) Medical necessity statement: On physical examination, cerumen impairs clinically significant portions of the external auditory canal, and tympanic membrane. Noted obstructive, copious cerumen that cannot be removed without magnification and irrigation. Consent: Discussed benefits and risks of procedure and verbal consent obtained Procedure: Patient was prepped for the procedure. Utilized an otoscope to assess and take note of the ear canal, the tympanic membrane, and the presence, amount, and placement of the cerumen. Gentle water irrigation used  to remove cerumen Post procedure examination: shows cerumen was completely removed. Patient tolerated procedure well. The patient is made aware that they may experience temporary vertigo, temporary hearing loss, and  temporary discomfort. If these symptom last for more than 24 hours to call the clinic or proceed to the ED.  Impression and Recommendations:    The patient was counselled, risk factors were discussed, anticipatory guidance given.  Temporomandibular joint click Patient will let me know if he would like a referral, I do think a custom mouthguard worn at night would probably provide some relief.  Annual physical exam Checking routine labs. Tetanus vaccine given today, declines flu shot.  Hearing loss due to cerumen impaction, right Multiple trials with irrigation, instrumentation and we are unable to fully remove the impacted cerumen. Adding Debrox otic solution. Return in 1-week for nurse visit to ensure clearance of the right cerumen impaction.  Ocular pain, right eye Referral to optometry for further evaluation. Exam is benign, vision is normal.  Hyperlipidemia We will do dietary modification for the next 3 months and then recheck.  ___________________________________________ Ihor Austin. Benjamin Stain, M.D., ABFM., CAQSM. Primary Care and Sports Medicine [facility]  Adjunct Instructor of Family Medicine  [employer]

## 2017-09-20 NOTE — Assessment & Plan Note (Addendum)
Multiple trials with irrigation, instrumentation and we are unable to fully remove the impacted cerumen. Adding Debrox otic solution. Return in 1-week for nurse visit to ensure clearance of the right cerumen impaction.

## 2017-09-21 DIAGNOSIS — E785 Hyperlipidemia, unspecified: Secondary | ICD-10-CM | POA: Insufficient documentation

## 2017-09-21 LAB — COMPREHENSIVE METABOLIC PANEL WITH GFR
AST: 25 U/L (ref 10–40)
Albumin: 5.1 g/dL (ref 3.6–5.1)
Alkaline phosphatase (APISO): 62 U/L (ref 40–115)
BUN: 18 mg/dL (ref 7–25)
Chloride: 100 mmol/L (ref 98–110)
Creat: 1.34 mg/dL (ref 0.60–1.35)
Globulin: 2.6 g/dL (ref 1.9–3.7)
Glucose, Bld: 84 mg/dL (ref 65–99)

## 2017-09-21 LAB — COMPREHENSIVE METABOLIC PANEL
AG Ratio: 2 (calc) (ref 1.0–2.5)
ALT: 32 U/L (ref 9–46)
CO2: 24 mmol/L (ref 20–32)
Calcium: 10.2 mg/dL (ref 8.6–10.3)
Potassium: 4.6 mmol/L (ref 3.5–5.3)
Sodium: 135 mmol/L (ref 135–146)
Total Bilirubin: 0.4 mg/dL (ref 0.2–1.2)
Total Protein: 7.7 g/dL (ref 6.1–8.1)

## 2017-09-21 LAB — CBC
HCT: 45.4 % (ref 38.5–50.0)
Hemoglobin: 15.2 g/dL (ref 13.2–17.1)
MCH: 27.6 pg (ref 27.0–33.0)
MCHC: 33.5 g/dL (ref 32.0–36.0)
MCV: 82.5 fL (ref 80.0–100.0)
MPV: 10.5 fL (ref 7.5–12.5)
Platelets: 397 10*3/uL (ref 140–400)
RBC: 5.5 Million/uL (ref 4.20–5.80)
RDW: 12.5 % (ref 11.0–15.0)
WBC: 10.7 10*3/uL (ref 3.8–10.8)

## 2017-09-21 LAB — LIPID PANEL W/REFLEX DIRECT LDL
Cholesterol: 208 mg/dL — ABNORMAL HIGH (ref <200)
HDL: 48 mg/dL (ref >40)
LDL Cholesterol (Calc): 142 mg/dL (calc) — ABNORMAL HIGH
Non-HDL Cholesterol (Calc): 160 mg/dL — ABNORMAL HIGH (ref <130)
Total CHOL/HDL Ratio: 4.3 (calc) (ref <5.0)
Triglycerides: 76 mg/dL (ref <150)

## 2017-09-21 LAB — HEMOGLOBIN A1C
Hgb A1c MFr Bld: 6 %{Hb} — ABNORMAL HIGH (ref <5.7)
Mean Plasma Glucose: 126 (calc)
eAG (mmol/L): 7 (calc)

## 2017-09-21 LAB — TSH: TSH: 2.08 mIU/L (ref 0.40–4.50)

## 2017-09-21 LAB — VITAMIN D 25 HYDROXY (VIT D DEFICIENCY, FRACTURES): Vit D, 25-Hydroxy: 21 ng/mL — ABNORMAL LOW (ref 30–100)

## 2017-09-21 LAB — HIV ANTIBODY (ROUTINE TESTING W REFLEX): HIV 1&2 Ab, 4th Generation: NONREACTIVE

## 2017-09-21 MED ORDER — VITAMIN D (ERGOCALCIFEROL) 1.25 MG (50000 UNIT) PO CAPS: 50000.0000 [IU] | ORAL_CAPSULE | ORAL | 0 refills | Status: DC

## 2017-09-21 NOTE — Assessment & Plan Note (Signed)
We will do dietary modification for the next 3 months and then recheck.

## 2017-09-21 NOTE — Addendum Note (Signed)
Addended by: Monica BectonHEKKEKANDAM, THOMAS J on: 09/21/2017 09:38 AM   Modules accepted: Orders

## 2017-09-27 ENCOUNTER — Ambulatory Visit (INDEPENDENT_AMBULATORY_CARE_PROVIDER_SITE_OTHER): Payer: BLUE CROSS/BLUE SHIELD | Admitting: Sports Medicine

## 2017-09-27 VITALS — BP 147/47 | HR 71

## 2017-09-27 DIAGNOSIS — H6121 Impacted cerumen, right ear: Secondary | ICD-10-CM

## 2017-09-27 NOTE — Assessment & Plan Note (Signed)
After a few days of Debrox, and aggressive irrigation we were able to remove the impaction.

## 2017-09-27 NOTE — Progress Notes (Signed)
   Indication: Cerumen impaction of the right ear(s) Medical necessity statement: On physical examination, cerumen impairs clinically significant portions of the external auditory canal, and tympanic membrane. Noted obstructive, copious cerumen that cannot be removed without magnification and instrumentations requiring physician skills Consent: Discussed benefits and risks of procedure and verbal consent obtained Procedure: Patient was prepped for the procedure. Utilized an otoscope to assess and take note of the ear canal, the tympanic membrane, and the presence, amount, and placement of the cerumen. Gentle water irrigation was utilized to remove cerumen.  Post procedure examination: shows cerumen was completely removed. Patient tolerated procedure well. The patient is made aware that they may experience temporary vertigo, temporary hearing loss, and temporary discomfort. If these symptom last for more than 24 hours to call the clinic or proceed to the ED.

## 2017-09-27 NOTE — Progress Notes (Signed)
Pt in today to recheck cerumen impaction after lavage & curette done in office, as well as Debrox to use at home. Pt still has cerumen in his ear, provider was able to clear it out.

## 2020-06-14 ENCOUNTER — Telehealth: Payer: Self-pay | Admitting: *Deleted

## 2020-06-14 NOTE — Telephone Encounter (Signed)
Spoke with pt's wife and transferred her to scheduling to make virtual appointment.

## 2020-06-14 NOTE — Telephone Encounter (Signed)
We can probably have them do a drive-by strep swab.  If clinical staff is not comfortable I am happy to do this.

## 2020-06-14 NOTE — Telephone Encounter (Signed)
Pt's wife left vm stating that he woke up yesterday with fever, bad sore throat, and body aches.  Temp is 100-101.  He went for covid test this morning so results aren't back yet.  They're concerned with possible strep and wanted to know if they needed to wait for the covid results before getting swabbed for strep.  Please advise.  (I will also advise them that they need to set up a virtual appointment with someone here as well)

## 2020-06-15 ENCOUNTER — Telehealth (INDEPENDENT_AMBULATORY_CARE_PROVIDER_SITE_OTHER): Payer: 59 | Admitting: Family Medicine

## 2020-06-15 ENCOUNTER — Encounter: Payer: Self-pay | Admitting: Family Medicine

## 2020-06-15 ENCOUNTER — Telehealth: Payer: Self-pay | Admitting: *Deleted

## 2020-06-15 DIAGNOSIS — J069 Acute upper respiratory infection, unspecified: Secondary | ICD-10-CM | POA: Diagnosis not present

## 2020-06-15 LAB — POCT RAPID STREP A (OFFICE): Rapid Strep A Screen: NEGATIVE

## 2020-06-15 NOTE — Telephone Encounter (Signed)
Pt had a virtual appointment with you this morning but wife left a vm on my line wanting to know if they can come by for a strep swab just to be sure.  Please advise.

## 2020-06-15 NOTE — Addendum Note (Signed)
Addended bySilvio Pate on: 06/15/2020 04:19 PM   Modules accepted: Orders

## 2020-06-15 NOTE — Assessment & Plan Note (Signed)
Symptoms consistent with viral URI.  COVID testing negative.  Recommend continued supportive care at home with increased fluid intake and rest.  He may use warm salt water gargles as needed for sore throat.  He may continue tylenol/ibuprofen as needed for fever and body aches.  Instructed to let us know if symptoms continue to worsen or if these are not resolving in 7-10 days.

## 2020-06-15 NOTE — Progress Notes (Signed)
Victor Lee - 39 y.o. male MRN [id number]  Date of birth: [birth year]   This visit type was conducted due to national recommendations for restrictions regarding the COVID-19 Pandemic (e.g. social distancing).  This format is felt to be most appropriate for this patient at this time.  All issues noted in this document were discussed and addressed.  No physical exam was performed (except for noted visual exam findings with Video Visits).  I discussed the limitations of evaluation and management by telemedicine and the availability of in person appointments. The patient expressed understanding and agreed to proceed.  I connected with@ on 06/15/20 at 10:10 AM EDT by a video enabled telemedicine application and verified that I am speaking with the correct person using two identifiers.  Present at visit: Everrett Coombe, DO Ilean China Rengel   Patient Location: Home [address] [place] [address]   Provider location:   [facility]  Chief Complaint  Patient presents with  . Fever    HPI  Victor Lee is a 39 y.o. male who presents via audio/video conferencing for a telehealth visit today.  He has complaint today of fever, body aches, sore throat and headache.  He reports that symptoms started 2 days ago.  He had COVID test yesterday which came back negative.  He has not had respiratory symptoms including cough or shortness of breath.  He did have some white spots on his tonsils but denies drainage from tonsils.  He has not had sinus or ear pain.     ROS:  A comprehensive ROS was completed and negative except as noted per HPI  No past medical history on file.  No past surgical history on file.  Family History  Problem Relation Age of Onset  . Heart attack Father   . High Cholesterol Father   . High blood pressure Father   . Cancer - Other Maternal Grandfather     Social History   Socioeconomic History  . Marital status: Married    Spouse name: Pierson Vantol  . Number of  children: 3  . Years of education: 64  . Highest education level: Associate degree: occupational, Scientist, product/process development, or vocational program  Occupational History  . Not on file  Tobacco Use  . Smoking status: Never Smoker  . Smokeless tobacco: Never Used  Substance and Sexual Activity  . Alcohol use: No  . Drug use: No  . Sexual activity: Yes    Partners: Female  Other Topics Concern  . Not on file  Social History Narrative  . Not on file   Social Determinants of Health   Financial Resource Strain:   . Difficulty of Paying Living Expenses: Not on file  Food Insecurity:   . Worried About Programme researcher, broadcasting/film/video in the Last Year: Not on file  . Ran Out of Food in the Last Year: Not on file  Transportation Needs:   . Lack of Transportation (Medical): Not on file  . Lack of Transportation (Non-Medical): Not on file  Physical Activity:   . Days of Exercise per Week: Not on file  . Minutes of Exercise per Session: Not on file  Stress:   . Feeling of Stress : Not on file  Social Connections:   . Frequency of Communication with Friends and Family: Not on file  . Frequency of Social Gatherings with Friends and Family: Not on file  . Attends Religious Services: Not on file  . Active Member of Clubs or Organizations: Not  on file  . Attends Banker Meetings: Not on file  . Marital Status: Not on file  Intimate Partner Violence:   . Fear of Current or Ex-Partner: Not on file  . Emotionally Abused: Not on file  . Physically Abused: Not on file  . Sexually Abused: Not on file     Current Outpatient Medications:  .  carbamide peroxide (DEBROX) 6.5 % OTIC solution, Place 5 drops into the right ear 2 (two) times daily., Disp: 15 mL, Rfl: 0  EXAM:  VITALS per patient if applicable: BP (!) 145/90   Pulse 92   Temp 99.4 F (37.4 C)   Ht 6\' 2"  (1.88 m)   Wt 268 lb (121.6 kg)   BMI 34.41 kg/m   GENERAL: alert, oriented, appears well and in no acute distress  HEENT:  atraumatic, conjunttiva clear, no obvious abnormalities on inspection of external nose and ears  NECK: normal movements of the head and neck  LUNGS: on inspection no signs of respiratory distress, breathing rate appears normal, no obvious gross SOB, gasping or wheezing  CV: no obvious cyanosis  MS: moves all visible extremities without noticeable abnormality  PSYCH/NEURO: pleasant and cooperative, no obvious depression or anxiety, speech and thought processing grossly intact  ASSESSMENT AND PLAN:  Discussed the following assessment and plan:  Viral URI Symptoms consistent with viral URI.  COVID testing negative.  Recommend continued supportive care at home with increased fluid intake and rest.  He may use warm salt water gargles as needed for sore throat.  He may continue tylenol/ibuprofen as needed for fever and body aches.  Instructed to let know if symptoms continue to worsen or if these are not resolving in 7-10 days.       I discussed the assessment and treatment plan with the patient. The patient was provided an opportunity to ask questions and all were answered. The patient agreed with the plan and demonstrated an understanding of the instructions.   The patient was advised to call back or seek an in-person evaluation if the symptoms worsen or if the condition fails to improve as anticipated.    9-10, DO

## 2020-06-15 NOTE — Telephone Encounter (Signed)
Sure, we can have him pull through drive through area to have this completed.

## 2020-06-15 NOTE — Progress Notes (Signed)
Symptoms started on Sunday morning. Sore throat Body aches Back pain  Fever 99.4 - 101 with medication alternating Tylenol and Ibuprofen  COVID: not vaccinated

## 2020-06-17 ENCOUNTER — Telehealth: Payer: Self-pay | Admitting: Sports Medicine

## 2020-06-17 NOTE — Telephone Encounter (Signed)
PT was seen virtually with Dr. Ashley Royalty. Sores in his mouth have been getting worse. Wife really wants it to be seen in office. Still having cold symptoms.   Please advise.

## 2020-06-18 ENCOUNTER — Encounter: Payer: Self-pay | Admitting: Family Medicine

## 2020-06-18 ENCOUNTER — Ambulatory Visit: Payer: BLUE CROSS/BLUE SHIELD | Admitting: Sports Medicine

## 2020-06-18 ENCOUNTER — Ambulatory Visit (INDEPENDENT_AMBULATORY_CARE_PROVIDER_SITE_OTHER): Payer: 59 | Admitting: Family Medicine

## 2020-06-18 VITALS — BP 133/85 | HR 113 | Temp 98.8°F | Ht 73.0 in | Wt 260.0 lb

## 2020-06-18 DIAGNOSIS — K121 Other forms of stomatitis: Secondary | ICD-10-CM | POA: Diagnosis not present

## 2020-06-18 DIAGNOSIS — K1379 Other lesions of oral mucosa: Secondary | ICD-10-CM

## 2020-06-18 DIAGNOSIS — H6123 Impacted cerumen, bilateral: Secondary | ICD-10-CM

## 2020-06-18 MED ORDER — VALACYCLOVIR HCL 1 G PO TABS: 1000.0000 mg | ORAL_TABLET | Freq: Two times a day (BID) | ORAL | 0 refills | Status: DC

## 2020-06-18 NOTE — Telephone Encounter (Signed)
Yes, as long as he has no fever, difficulty with smell or taste then I think it is okay to see him as long as he keeps his mask on in the waiting room.

## 2020-06-18 NOTE — Telephone Encounter (Signed)
Please advise if OK for him to be seen in office

## 2020-06-18 NOTE — Progress Notes (Signed)
Pt reports that while he was brushing his teeth on Friday he hit the roof of his mouth with the toothbrush and it began to bleed.  On Saturday he said that he felt "weird" and when he starts to get a fever it will feel like he has been sunburned and it starts in his back.  Sunday morning he woke up with a fever of 100. That day and the days following he said that his temperature would go from 100 to 102.   On Tuesday he did a virtual visit with Dr. Ashley Royalty for headache and fever. He was did a drive thru to be swabbed for strep this was negative.   He has been using salt water to help with this but reports that it really hasn't gotten any better. His throat is still bothering him and he has pain if his tongue hits the roof of his mouth he said it feels like the skin is being peeled away    He hasn't been able to eat/drink because of this.   He has a daughter that is being seen for throat pain today at her pediatricians office.

## 2020-06-18 NOTE — Telephone Encounter (Signed)
Patient has been scheduled for this PM, only opening available was with Dr Linford Arnold. Sending this note to her as an [place]

## 2020-06-18 NOTE — Progress Notes (Addendum)
Acute Office Visit  Subjective:    Patient ID: Victor Lee, male    DOB: 07-16-1981, 39 y.o.   MRN: [id number]  Chief Complaint  Patient presents with  . Sore Throat  . Mouth Lesions    HPI Patient is in today for mouth sores and fever. Sx stared about 6 days ago. Says about 8 days ago his the roof of his mouth with his tooth brush. Then on Sunday 6 days developed and fever and noticed it was painful to swallow.  Then by Monday noticed ulceration in his mouth.  He is already been tested for strep throat, flu and Covid twice.  He was given nystatin swish and swallow at the beginning of the week after doing a virtual visit.  He said after 2 days it actually made everything feel worse so he stopped using it.  He then went to [facility] today and was given prednisone he has not filled it or started it yet.  No past medical history on file.  No past surgical history on file.  Family History  Problem Relation Age of Onset  . Heart attack Father   . High Cholesterol Father   . High blood pressure Father   . Cancer - Other Maternal Grandfather     Social History   Socioeconomic History  . Marital status: Married    Spouse name: Marisa Hufstetler  . Number of children: 3  . Years of education: 82  . Highest education level: Associate degree: occupational, Scientist, product/process development, or vocational program  Occupational History  . Not on file  Tobacco Use  . Smoking status: Never Smoker  . Smokeless tobacco: Never Used  Substance and Sexual Activity  . Alcohol use: No  . Drug use: No  . Sexual activity: Yes    Partners: Female  Other Topics Concern  . Not on file  Social History Narrative  . Not on file   Social Determinants of Health   Financial Resource Strain:   . Difficulty of Paying Living Expenses: Not on file  Food Insecurity:   . Worried About Programme researcher, broadcasting/film/video in the Last Year: Not on file  . Ran Out of Food in the Last Year: Not on file  Transportation Needs:   . Lack  of Transportation (Medical): Not on file  . Lack of Transportation (Non-Medical): Not on file  Physical Activity:   . Days of Exercise per Week: Not on file  . Minutes of Exercise per Session: Not on file  Stress:   . Feeling of Stress : Not on file  Social Connections:   . Frequency of Communication with Friends and Family: Not on file  . Frequency of Social Gatherings with Friends and Family: Not on file  . Attends Religious Services: Not on file  . Active Member of Clubs or Organizations: Not on file  . Attends Banker Meetings: Not on file  . Marital Status: Not on file  Intimate Partner Violence:   . Fear of Current or Ex-Partner: Not on file  . Emotionally Abused: Not on file  . Physically Abused: Not on file  . Sexually Abused: Not on file    Outpatient Medications Prior to Visit  Medication Sig Dispense Refill  . carbamide peroxide (DEBROX) 6.5 % OTIC solution Place 5 drops into the right ear 2 (two) times daily. 15 mL 0   No facility-administered medications prior to visit.    No Known Allergies  Review of Systems  Objective:    Physical Exam Constitutional:      Appearance: He is well-developed.  HENT:     Head: Normocephalic and atraumatic.     Right Ear: External ear normal.     Left Ear: External ear normal.     Ears:     Comments: Canals were blocked by cerumen.    Nose: Nose normal.     Mouth/Throat:     Mouth: Mucous membranes are moist.     Comments: On the right upper palate anteriorly there is a large ulcerated area.  Over the right, upper, outer gumline he has several ulcerations as well.  The posterior pharynx is very erythematous with a white ulceration on the left side over the tonsil. Eyes:     Conjunctiva/sclera: Conjunctivae normal.     Pupils: Pupils are equal, round, and reactive to light.  Neck:     Thyroid: No thyromegaly.  Cardiovascular:     Rate and Rhythm: Normal rate.     Heart sounds: Normal heart sounds.   Pulmonary:     Effort: Pulmonary effort is normal.     Breath sounds: Normal breath sounds.  Musculoskeletal:     Cervical back: Neck supple.  Lymphadenopathy:     Cervical: No cervical adenopathy.  Skin:    General: Skin is warm and dry.  Neurological:     Mental Status: He is alert and oriented to person, place, and time.     BP 133/85   Pulse (!) 113   Temp 98.8 F (37.1 C)   Ht 6\' 1"  (1.854 m)   Wt 260 lb (117.9 kg)   SpO2 99%   BMI 34.30 kg/m  Wt Readings from Last 3 Encounters:  06/18/20 260 lb (117.9 kg)  06/15/20 268 lb (121.6 kg)  09/20/17 271 lb (122.9 kg)    Health Maintenance Due  Topic Date Due  . Hepatitis C Screening  Never done    There are no preventive care reminders to display for this patient.   Lab Results  Component Value Date   TSH 2.08 09/20/2017   Lab Results  Component Value Date   WBC 10.7 09/20/2017   HGB 15.2 09/20/2017   HCT 45.4 09/20/2017   MCV 82.5 09/20/2017   PLT 397 09/20/2017   Lab Results  Component Value Date   NA 135 09/20/2017   K 4.6 09/20/2017   CO2 24 09/20/2017   GLUCOSE 84 09/20/2017   BUN 18 09/20/2017   CREATININE 1.34 09/20/2017   BILITOT 0.4 09/20/2017   AST 25 09/20/2017   ALT 32 09/20/2017   PROT 7.7 09/20/2017   CALCIUM 10.2 09/20/2017   Lab Results  Component Value Date   CHOL 208 (H) 09/20/2017   Lab Results  Component Value Date   HDL 48 09/20/2017   Lab Results  Component Value Date   LDLCALC 142 (H) 09/20/2017   Lab Results  Component Value Date   TRIG 76 09/20/2017   Lab Results  Component Value Date   CHOLHDL 4.3 09/20/2017   Lab Results  Component Value Date   HGBA1C 6.0 (H) 09/20/2017       Assessment & Plan:   Problem List Items Addressed This Visit    None    Visit Diagnoses    Oral ulcer    -  Primary   Mouth pain       Bilateral impacted cerumen       Relevant Orders   Ambulatory referral to ENT  Several oral ulcerations and erythema the  posterior pharynx.  Most consistent with a viral etiology such as herpes virus.  Discussed possible treatment options and actually can place him on valacyclovir and did encourage him to pick up the prednisone which I actually think would be helpful he was also given a prescription for lidocaine spray and think this would help temporarily with numbing I think he will be feeling better hopefully by Monday did not palpate any large  lymph nodes in this neck.  If fever or ulcerations persist after the weekend then please give [place] a call back and we will do additional work-up including labs.  He has no other underlying autoimmune disorders.  Meds ordered this encounter  Medications  . valACYclovir (VALTREX) 1000 MG tablet    Sig: Take 1 tablet (1,000 mg total) by mouth 2 (two) times daily.    Dispense:  20 tablet    Refill:  0     Nani Gasser, MD

## 2020-06-18 NOTE — Addendum Note (Signed)
Addended by: Nani Gasser D on: 06/18/2020 05:26 PM   Modules accepted: Orders

## 2020-08-12 ENCOUNTER — Other Ambulatory Visit: Payer: Self-pay

## 2020-08-12 ENCOUNTER — Encounter (INDEPENDENT_AMBULATORY_CARE_PROVIDER_SITE_OTHER): Payer: Self-pay | Admitting: Otolaryngology

## 2020-08-12 ENCOUNTER — Ambulatory Visit (INDEPENDENT_AMBULATORY_CARE_PROVIDER_SITE_OTHER): Payer: 59 | Admitting: Otolaryngology

## 2020-08-12 VITALS — Temp 97.2°F

## 2020-08-12 DIAGNOSIS — H6123 Impacted cerumen, bilateral: Secondary | ICD-10-CM

## 2020-08-12 NOTE — Progress Notes (Signed)
HPI: Victor Lee is a 39 y.o. male who presents is referred by his PCP for evaluation of bilateral cerumen impactions.  He had his ears previously cleaned several years ago with irrigation.  He denies any ear pain or trouble hearing.[name]  No past medical history on file. No past surgical history on file. Social History   Socioeconomic History  . Marital status: Married    Spouse name: Victor Lee  . Number of children: 3  . Years of education: 73  . Highest education level: Associate degree: occupational, Scientist, product/process development, or vocational program  Occupational History  . Not on file  Tobacco Use  . Smoking status: Never Smoker  . Smokeless tobacco: Never Used  Substance and Sexual Activity  . Alcohol use: No  . Drug use: No  . Sexual activity: Yes    Partners: Female  Other Topics Concern  . Not on file  Social History Narrative  . Not on file   Social Determinants of Health   Financial Resource Strain: Not on file  Food Insecurity: Not on file  Transportation Needs: Not on file  Physical Activity: Not on file  Stress: Not on file  Social Connections: Not on file   Family History  Problem Relation Age of Onset  . Heart attack Father   . High Cholesterol Father   . High blood pressure Father   . Cancer - Other Maternal Grandfather    No Known Allergies Prior to Admission medications   Medication Sig Start Date End Date Taking? Authorizing Provider  valACYclovir (VALTREX) 1000 MG tablet Take 1 tablet (1,000 mg total) by mouth 2 (two) times daily. 06/18/20   Agapito Games, MD     Positive ROS: Otherwise negative  All other systems have been reviewed and were otherwise negative with the exception of those mentioned in the HPI and as above.  Physical Exam: Constitutional: Alert, well-appearing, no acute distress Ears: External ears without lesions or tenderness.  Both ear canals are completely occluded with a large amount of cerumen that was removed with curette  forceps and suction.  TMs were clear bilaterally. Nasal: External nose without lesions. . Clear nasal passages Oral: Lips and gums without lesions. Tongue and palate mucosa without lesions. Posterior oropharynx clear. Neck: No palpable adenopathy or masses Respiratory: Breathing comfortably  Skin: No facial/neck lesions or rash noted.  Cerumen impaction removal  Date/Time: 08/12/2020 2:59 PM Performed by: Drema Halon, MD Authorized by: Drema Halon, MD   Consent:    Consent obtained:  Verbal   Consent given by:  Patient   Risks discussed:  Pain and bleeding Procedure details:    Location:  L ear and R ear   Procedure type: curette, suction and forceps   Post-procedure details:    Inspection:  TM intact and canal normal   Hearing quality:  Improved   Patient tolerance of procedure:  Tolerated well, no immediate complications Comments:     TMs are clear bilaterally    Assessment: Bilateral cerumen impactions  Plan: This was cleaned in the office.  He will follow-up as needed. Discussed with him concerning recommendations to have his ears cleaned every year or 2.   Narda Bonds, MD   CC:

## 2020-09-21 ENCOUNTER — Telehealth: Payer: Self-pay

## 2020-09-21 NOTE — Telephone Encounter (Signed)
Per wife, patient is unvaccinated and will need to quarantine for 10 days. Advised to treat symptoms with OTC meds and look for shortness of breath and swelling in the feet/legs. She will let us know if anything changes.

## 2020-09-21 NOTE — Telephone Encounter (Signed)
Quarantine for 10 days if not vaccinated, 5 days if vaccinated, mask wear after that.  Further details need an appointment.

## 2020-09-21 NOTE — Telephone Encounter (Signed)
COVID positive. Any suggestions?

## 2020-09-23 ENCOUNTER — Other Ambulatory Visit: Payer: Self-pay

## 2020-09-23 ENCOUNTER — Telehealth (INDEPENDENT_AMBULATORY_CARE_PROVIDER_SITE_OTHER): Payer: 59 | Admitting: Family Medicine

## 2020-09-23 ENCOUNTER — Encounter: Payer: Self-pay | Admitting: Family Medicine

## 2020-09-23 VITALS — Ht 73.0 in | Wt 259.9 lb

## 2020-09-23 DIAGNOSIS — U071 COVID-19: Secondary | ICD-10-CM

## 2020-09-23 NOTE — Patient Instructions (Signed)
Vitamins to take with covid   1) vitamin D3: 5000IU/day 2) zinc: 100mg /day 3) vitamin C: 1000mg  three times a day 4) quercetin: 500mg  twice a day 5) melatonin 10mg /night 6) aspirin   Mouthwash three times a day 1% iodine drops in nose once/day  Outside and deep breathing.

## 2020-09-23 NOTE — Progress Notes (Signed)
Patient: Victor Lee MRN: [id number] DOB: [birth year] PCP: Monica Becton, MD     I connected with Havard Radigan Cranmer on 09/23/20 at 2:40pm by a video enabled telemedicine application and verified that I am speaking with the correct person using two identifiers.  Location patient: Home Location provider: [facility], Office Persons participating in this virtual visit: Victor Lee, Franchot Pollitt (wife) and Dr. Artis Flock   I discussed the limitations of evaluation and management by telemedicine and the availability of in person appointments. The patient expressed understanding and agreed to proceed.   Subjective:  Chief Complaint  Patient presents with  . Covid Positive  . Cough    HPI: The patient is a 40 y.o. male who presents today for Positive Covid results. Symptoms started Sunday. He was around a fire Saturday night and had a cough in his chest. Faded Sunday morning. He states on Sunday he was really tired and didn't feel great. He states the back of his arms and his back felt like he had a sunburn and he had a low grade fever Sunday night. Monday and Tuesday he had body aches and joint pain and felt like he had been beat. Also had a headache and eye pain. He started to feel better yesterday and not as fatigued. This morning woke up feeling better, but ribs and back still sore. He states the skin on his back still hurts like a sunburn, but it's better. He still has a cough that is deep and congested. Is dry and at times productive. He is not short of breath or wheezing. He does not feel tight in his chest either. He is not a smoker and overall healthy with no medical issues. He is not vaccinated. No prior hx of infection.   Review of Systems  Constitutional: Negative for chills and fatigue.  HENT: Positive for congestion. Negative for sore throat.   Respiratory: Positive for cough. Negative for chest tightness and shortness of breath.   Gastrointestinal: Negative for abdominal pain and  diarrhea.  Neurological: Negative for dizziness and headaches.    Allergies Patient has No Known Allergies.  Past Medical History Patient  has no past medical history on file.  Surgical History Patient  has no past surgical history on file.  Family History Pateint's family history includes Cancer - Other in his maternal grandfather; Heart attack in his father; High Cholesterol in his father; High blood pressure in his father.  Social History Patient  reports that he has never smoked. He has never used smokeless tobacco. He reports that he does not drink alcohol and does not use drugs.    Objective: Vitals:   09/23/20 1413  Weight: 259 lb 14.8 oz (117.9 kg)  Height: 6\' 1"  (1.854 m)    Body mass index is 34.29 kg/m.  Physical Exam Vitals reviewed.  Constitutional:      General: He is not in acute distress.    Appearance: Normal appearance. He is normal weight. He is not ill-appearing.  HENT:     Head: Normocephalic and atraumatic.  Pulmonary:     Effort: Pulmonary effort is normal.     Comments: Talking in full complete sentences with no work of breathing or accessory muscles. Comfortable.  Neurological:     General: No focal deficit present.     Mental Status: He is alert and oriented to person, place, and time.  Psychiatric:        Mood and Affect: Mood normal.  Behavior: Behavior normal.        Assessment/plan: 1. COVID-19 -low risk patient. Overall doing well with good oxygenation.  -starting him on treatment nutraceutical bundle including zinc sulfate, vit D, vit C, quercetin and melatonin 5-15+ days depending on symptoms. He has already started this.  -iodine 1% nasal drop 1-2x/day -ivermectin X5 days. Discussed with family that this is still considered experimental; however, multiple studies (over 71 at this point) have been done that have shown significant benefit with it's use and it's safety profile can not be beat. Side effects discussed. They  would like to proceed.  -At this point no pulmonary signs/symptoms. If so we will add prozac 30mg /day, pulmicort and if needed anti-androgens and oral steroids. Will follow closely.  -gargle mouthwash TID -outside as much as possible.  -pulse ox >93-94% -prone sleeping if possible.  -strict ED precautions given.       Return if symptoms worsen or fail to improve.    , MD [facility]  09/23/2020

## 2021-08-09 ENCOUNTER — Ambulatory Visit: Payer: 59 | Admitting: Family Medicine

## 2021-08-09 ENCOUNTER — Ambulatory Visit (INDEPENDENT_AMBULATORY_CARE_PROVIDER_SITE_OTHER): Payer: 59

## 2021-08-09 ENCOUNTER — Other Ambulatory Visit: Payer: Self-pay

## 2021-08-09 ENCOUNTER — Encounter: Payer: Self-pay | Admitting: Family Medicine

## 2021-08-09 VITALS — BP 152/92 | HR 91 | Temp 98.1°F | Resp 18 | Ht 74.0 in | Wt 289.0 lb

## 2021-08-09 DIAGNOSIS — I10 Essential (primary) hypertension: Secondary | ICD-10-CM

## 2021-08-09 DIAGNOSIS — R079 Chest pain, unspecified: Secondary | ICD-10-CM

## 2021-08-09 DIAGNOSIS — R0602 Shortness of breath: Secondary | ICD-10-CM

## 2021-08-09 DIAGNOSIS — K219 Gastro-esophageal reflux disease without esophagitis: Secondary | ICD-10-CM | POA: Insufficient documentation

## 2021-08-09 DIAGNOSIS — K2100 Gastro-esophageal reflux disease with esophagitis, without bleeding: Secondary | ICD-10-CM | POA: Diagnosis not present

## 2021-08-09 LAB — CBC WITH DIFFERENTIAL/PLATELET
Absolute Monocytes: 778 cells/uL (ref 200–950)
Basophils Absolute: 77 cells/uL (ref 0–200)
Basophils Relative: 0.8 %
Eosinophils Absolute: 269 cells/uL (ref 15–500)
Eosinophils Relative: 2.8 %
HCT: 46.8 % (ref 38.5–50.0)
Hemoglobin: 15.6 g/dL (ref 13.2–17.1)
Lymphs Abs: 2275 cells/uL (ref 850–3900)
MCH: 28.6 pg (ref 27.0–33.0)
MCHC: 33.3 g/dL (ref 32.0–36.0)
MCV: 85.9 fL (ref 80.0–100.0)
MPV: 10 fL (ref 7.5–12.5)
Monocytes Relative: 8.1 %
Neutro Abs: 6202 cells/uL (ref 1500–7800)
Neutrophils Relative %: 64.6 %
Platelets: 414 10*3/uL — ABNORMAL HIGH (ref 140–400)
RBC: 5.45 10*6/uL (ref 4.20–5.80)
RDW: 12.8 % (ref 11.0–15.0)
Total Lymphocyte: 23.7 %
WBC: 9.6 10*3/uL (ref 3.8–10.8)

## 2021-08-09 LAB — COMPLETE METABOLIC PANEL WITH GFR
AG Ratio: 2 (calc) (ref 1.0–2.5)
ALT: 44 U/L (ref 9–46)
AST: 24 U/L (ref 10–40)
Albumin: 5.1 g/dL (ref 3.6–5.1)
Alkaline phosphatase (APISO): 46 U/L (ref 36–130)
BUN: 19 mg/dL (ref 7–25)
CO2: 27 mmol/L (ref 20–32)
Calcium: 10.3 mg/dL (ref 8.6–10.3)
Chloride: 103 mmol/L (ref 98–110)
Creat: 0.98 mg/dL (ref 0.60–1.29)
Globulin: 2.6 g/dL (calc) (ref 1.9–3.7)
Glucose, Bld: 122 mg/dL — ABNORMAL HIGH (ref 65–99)
Potassium: 4.2 mmol/L (ref 3.5–5.3)
Sodium: 138 mmol/L (ref 135–146)
Total Bilirubin: 0.3 mg/dL (ref 0.2–1.2)
Total Protein: 7.7 g/dL (ref 6.1–8.1)
eGFR: 100 mL/min/{1.73_m2} (ref >=60)

## 2021-08-09 LAB — TSH: TSH: 2.14 mIU/L (ref 0.40–4.50)

## 2021-08-09 LAB — D-DIMER, QUANTITATIVE: D-Dimer, Quant: 0.19 mcg/mL FEU (ref <0.50)

## 2021-08-09 MED ORDER — PANTOPRAZOLE SODIUM 40 MG PO TBEC: 40.0000 mg | DELAYED_RELEASE_TABLET | Freq: Every day | ORAL | 3 refills | Status: DC

## 2021-08-09 MED ORDER — LISINOPRIL 10 MG PO TABS: 10.0000 mg | ORAL_TABLET | Freq: Every day | ORAL | 1 refills | Status: DC

## 2021-08-09 NOTE — Progress Notes (Signed)
No sign of blood clot or anemia or infection.  Some labs are still pending. Cut back on salt intake and start BP medication.

## 2021-08-09 NOTE — Progress Notes (Signed)
a  Acute Office Visit  Subjective:    Patient ID: Victor Lee, male    DOB: 06-06-1981, 40 y.o.   MRN: [id number]  Chief Complaint  Patient presents with   Shortness of Breath    Off and on for 4 days. Chest discomfort     HPI Patient is in today for SOB x 4 days and some chest discomfort on and off.  He says the sensation really started on Friday where he just felt short of breath most of the day.  He says it is a sensation of feeling like he cannot quite get a deep breath in.  He says since then its been more on and off it can last for several minutes maybe up to 20 minutes and then he will feel like he can take a normal deep breath.  He has not had any fevers chills sweats or cough.  He is not had any known sick contacts.  He does say that his head feels a little "dizzy" but says its not exactly dizziness or lightheadedness he just does not know how to describe it.  The shortness of breath is not worse or aggravated by activity he was able to rip the floor up yesterday and it did not stop him.  He denies any known chemical exposures etc. that may have triggered this on Friday.  No prior history of asthma or breathing disorders.  He is also been having some intermittent chest pain sometimes on the right sometimes on the left.  He says its not so much as a pressure as it is just a slight pain or discomfort it can last for several minutes and then go away.  He does have a history that is significant for GERD and does have frequent GERD symptoms.  He sometimes takes sodium bicarb for this.  He used to take Tums until he had kidney stones.  He had he says the GERD sometimes will actually wake him up at night to the point where he is choking.  But he knows it is related to certain foods or if he eats really late at night.  He says that the shortness of breath sensation is almost similar to the sensation of when you have overeaten and you feel like your belly is so full that it makes it hard to take a  deep breath.  History reviewed. No pertinent past medical history.  History reviewed. No pertinent surgical history.  Family History  Problem Relation Age of Onset   Heart attack Father    High Cholesterol Father    High blood pressure Father    Cancer - Other Maternal Grandfather     Social History   Socioeconomic History   Marital status: Married    Spouse name: Claiborne Billings Roeder   Number of children: 3   Years of education: 14   Highest education level: Associate degree: occupational, Hotel manager, or vocational program  Occupational History   Not on file  Tobacco Use   Smoking status: Never   Smokeless tobacco: Never  Substance and Sexual Activity   Alcohol use: No   Drug use: No   Sexual activity: Yes    Partners: Female  Other Topics Concern   Not on file  Social History Narrative   Not on file   Social Determinants of Health   Financial Resource Strain: Not on file  Food Insecurity: Not on file  Transportation Needs: Not on file  Physical Activity: Not on file  Stress:  Not on file  Social Connections: Not on file  Intimate Partner Violence: Not on file    Outpatient Medications Prior to Visit  Medication Sig Dispense Refill   D3 50 MCG (2000 UT) TABS Take 1 tablet by mouth daily.     valACYclovir (VALTREX) 1000 MG tablet Take 1 tablet (1,000 mg total) by mouth 2 (two) times daily. (Patient not taking: Reported on 09/23/2020) 20 tablet 0   No facility-administered medications prior to visit.    No Known Allergies  Review of Systems     Objective:    Physical Exam Constitutional:      Appearance: He is well-developed.  HENT:     Head: Normocephalic and atraumatic.  Cardiovascular:     Rate and Rhythm: Normal rate and regular rhythm.     Heart sounds: Normal heart sounds.  Pulmonary:     Effort: Pulmonary effort is normal.     Breath sounds: Normal breath sounds.  Skin:    General: Skin is warm and dry.  Neurological:     Mental Status: He is  alert and oriented to person, place, and time.  Psychiatric:        Behavior: Behavior normal.    BP (!) 152/92   Pulse 91   Temp 98.1 F (36.7 C)   Resp 18   Ht 6' 2"  (1.88 m)   Wt 289 lb (131.1 kg)   SpO2 100%   BMI 37.11 kg/m  Wt Readings from Last 3 Encounters:  08/09/21 289 lb (131.1 kg)  09/23/20 259 lb 14.8 oz (117.9 kg)  06/18/20 260 lb (117.9 kg)    Health Maintenance Due  Topic Date Due   Hepatitis C Screening  Never done    There are no preventive care reminders to display for this patient.   Lab Results  Component Value Date   TSH 2.14 08/09/2021   Lab Results  Component Value Date   WBC 9.6 08/09/2021   HGB 15.6 08/09/2021   HCT 46.8 08/09/2021   MCV 85.9 08/09/2021   PLT 414 (H) 08/09/2021   Lab Results  Component Value Date   NA 138 08/09/2021   K 4.2 08/09/2021   CO2 27 08/09/2021   GLUCOSE 122 (H) 08/09/2021   BUN 19 08/09/2021   CREATININE 0.98 08/09/2021   BILITOT 0.3 08/09/2021   AST 24 08/09/2021   ALT 44 08/09/2021   PROT 7.7 08/09/2021   CALCIUM 10.3 08/09/2021   EGFR 100 08/09/2021   Lab Results  Component Value Date   CHOL 208 (H) 09/20/2017   Lab Results  Component Value Date   HDL 48 09/20/2017   Lab Results  Component Value Date   LDLCALC 142 (H) 09/20/2017   Lab Results  Component Value Date   TRIG 76 09/20/2017   Lab Results  Component Value Date   CHOLHDL 4.3 09/20/2017   Lab Results  Component Value Date   HGBA1C 6.0 (H) 09/20/2017       Assessment & Plan:   Problem List Items Addressed This Visit       Digestive   GERD (gastroesophageal reflux disease)   Relevant Medications   pantoprazole (PROTONIX) 40 MG tablet   Other Visit Diagnoses     Chest pain, unspecified type    -  Primary   Relevant Orders   EKG 12-Lead   DG Chest 2 View (Completed)   CBC with Differential/Platelet (Completed)   TSH (Completed)   D-Dimer, Quantitative (Completed)   COMPLETE METABOLIC  PANEL WITH GFR   SOB  (shortness of breath)       Relevant Orders   DG Chest 2 View (Completed)   CBC with Differential/Platelet (Completed)   TSH (Completed)   D-Dimer, Quantitative (Completed)   COMPLETE METABOLIC PANEL WITH GFR   Essential hypertension       Relevant Medications   lisinopril (ZESTRIL) 10 MG tablet   Other Relevant Orders   COMPLETE METABOLIC PANEL WITH GFR      Shortness of breath-unclear etiology he did have some abnormal sounds in the right upper lobe so would like to get a chest x-ray today for further evaluation we also discussed other causes such as pneumothorax, pneumonia or infection, his GERD could actually be contributing significantly as well so we discussed further work-up with blood work, chest x-ray and starting a PPI.  We will call with results once available.  GERD-I do recommend that he start a daily PPI it sounds like he is having pretty significant symptoms even before the shortness of breath started.  Blood pressure significantly elevated today and he says it usually running in the 140s so we will start lisinopril blood pressure medication have follow-up with his PCP in 2 weeks.  Typical chest pain-EKG normal as below.  Consider GERD or could be pulmonary related since he is also feeling short of breath.  EKG shows rate of 70 bpm, normal sinus rhythm with no acute ST-T waves.  Meds ordered this encounter  Medications   lisinopril (ZESTRIL) 10 MG tablet    Sig: Take 1 tablet (10 mg total) by mouth daily.    Dispense:  30 tablet    Refill:  1   pantoprazole (PROTONIX) 40 MG tablet    Sig: Take 1 tablet (40 mg total) by mouth daily.    Dispense:  30 tablet    Refill:  3      Beatrice Lecher, MD

## 2021-08-09 NOTE — Progress Notes (Signed)
Hi Victor Lee, your chest xray looks good, no specific findings.

## 2021-08-10 NOTE — Progress Notes (Signed)
Hi [place], platelets are just a little bit elevated but that is not worrisome.  Sometimes that can happen with a little bit of inflammation.  Your thyroid looks good.  No sign of a blood clot anywhere.  Your blood sugar was slightly elevated but I do not think you were fasting.  Liver enzymes look good.  Kidney function looks good.  Focus on getting your blood pressure down and see if you notice an improvement in how you are feeling.

## 2021-08-23 ENCOUNTER — Ambulatory Visit (INDEPENDENT_AMBULATORY_CARE_PROVIDER_SITE_OTHER): Payer: 59 | Admitting: Sports Medicine

## 2021-08-23 ENCOUNTER — Encounter: Payer: Self-pay | Admitting: Sports Medicine

## 2021-08-23 ENCOUNTER — Other Ambulatory Visit: Payer: Self-pay

## 2021-08-23 DIAGNOSIS — I10 Essential (primary) hypertension: Secondary | ICD-10-CM

## 2021-08-23 DIAGNOSIS — E7841 Elevated Lipoprotein(a): Secondary | ICD-10-CM

## 2021-08-23 DIAGNOSIS — R0602 Shortness of breath: Secondary | ICD-10-CM

## 2021-08-23 DIAGNOSIS — E6609 Other obesity due to excess calories: Secondary | ICD-10-CM | POA: Diagnosis not present

## 2021-08-23 DIAGNOSIS — E669 Obesity, unspecified: Secondary | ICD-10-CM | POA: Insufficient documentation

## 2021-08-23 NOTE — Assessment & Plan Note (Signed)
This is a pleasant 40 year old male, he is gained a lot of weight over the past several years, blood pressures have been elevated on the last several occasions, no signs or symptoms of endorgan damage. He has been working on cutting back his fast food, and he plans to lose about 15 pounds over the next 3 months with diet and exercise, we will set these targets and he will see me back in about 3 months before we add blood pressure medicine, I would like him to do a home diary of his blood pressures as well over the next week.

## 2021-08-23 NOTE — Assessment & Plan Note (Signed)
Ah was having a few episodes of shortness of breath, he saw my partner, CBC, D-dimer, chest x-ray were unremarkable, symptoms were intermittent and have resolved on their own. He had no other associated symptoms, no associated palpitations, he does endorse a history of skipped beats in the past, and he tells me he may have worn a Holter monitor, no intervention was recommended, if he gets additional episodes of difficulty catching his breath, or palpitations we will set him up for a long-term monitor.

## 2021-08-23 NOTE — Progress Notes (Addendum)
° ° °  Procedures performed today:    None.  Independent interpretation of notes and tests performed by another provider:   None.  Brief History, Exam, Impression, and Recommendations:    Benign essential hypertension This is a pleasant 40 year old male, he is gained a lot of weight over the past several years, blood pressures have been elevated on the last several occasions, no signs or symptoms of endorgan damage. He has been working on cutting back his fast food, and he plans to lose about 15 pounds over the next 3 months with diet and exercise, we will set these targets and he will see me back in about 3 months before we add blood pressure medicine, I would like him to do a home diary of his blood pressures as well over the next week.  Hyperlipidemia Lipids historically elevated, 3 months of dietary modification and then we can recheck.  Obesity Does need to lose some weight, we will set the initial target of 15 pounds over the next 3 months.  Shortness of breath Victor Lee was having a few episodes of shortness of breath, he saw my partner, CBC, D-dimer, chest x-ray were unremarkable, symptoms were intermittent and have resolved on their own. He had no other associated symptoms, no associated palpitations, he does endorse a history of skipped beats in the past, and he tells me he may have worn a Holter monitor, no intervention was recommended, if he gets additional episodes of difficulty catching his breath, or palpitations we will set him up for a long-term monitor.  Hypercalcemia Significant hypercalcemia noted on labs also leukocytosis. We will repeat CBC, as well as calcium levels with PTH in a week.    ___________________________________________ Ihor Austin. Benjamin Stain, M.D., ABFM., CAQSM. Primary Care and Sports Medicine [facility]  Adjunct Instructor of Family Medicine  [employer]

## 2021-08-23 NOTE — Assessment & Plan Note (Signed)
Does need to lose some weight, we will set the initial target of 15 pounds over the next 3 months.

## 2021-08-23 NOTE — Assessment & Plan Note (Signed)
Lipids historically elevated, 3 months of dietary modification and then we can recheck.

## 2021-08-24 ENCOUNTER — Encounter: Payer: Self-pay | Admitting: Sports Medicine

## 2021-08-24 LAB — CBC
HCT: 45.9 % (ref 38.5–50.0)
Hemoglobin: 15.4 g/dL (ref 13.2–17.1)
MCH: 28.4 pg (ref 27.0–33.0)
MCHC: 33.6 g/dL (ref 32.0–36.0)
MCV: 84.5 fL (ref 80.0–100.0)
MPV: 10.5 fL (ref 7.5–12.5)
Platelets: 477 10*3/uL — ABNORMAL HIGH (ref 140–400)
RBC: 5.43 10*6/uL (ref 4.20–5.80)
RDW: 12.3 % (ref 11.0–15.0)
WBC: 12 10*3/uL — ABNORMAL HIGH (ref 3.8–10.8)

## 2021-08-24 LAB — LIPID PANEL
Cholesterol: 235 mg/dL — ABNORMAL HIGH (ref <200)
HDL: 47 mg/dL (ref >=40)
LDL Cholesterol (Calc): 168 mg/dL (calc) — ABNORMAL HIGH
Non-HDL Cholesterol (Calc): 188 mg/dL (calc) — ABNORMAL HIGH (ref <130)
Total CHOL/HDL Ratio: 5 (calc) — ABNORMAL HIGH (ref <5.0)
Triglycerides: 95 mg/dL (ref <150)

## 2021-08-24 LAB — COMPREHENSIVE METABOLIC PANEL
AG Ratio: 1.8 (calc) (ref 1.0–2.5)
ALT: 35 U/L (ref 9–46)
AST: 30 U/L (ref 10–40)
Albumin: 5.2 g/dL — ABNORMAL HIGH (ref 3.6–5.1)
Alkaline phosphatase (APISO): 50 U/L (ref 36–130)
BUN: 14 mg/dL (ref 7–25)
CO2: 29 mmol/L (ref 20–32)
Calcium: 11 mg/dL — ABNORMAL HIGH (ref 8.6–10.3)
Chloride: 101 mmol/L (ref 98–110)
Creat: 1.18 mg/dL (ref 0.60–1.29)
Globulin: 2.9 g/dL (calc) (ref 1.9–3.7)
Glucose, Bld: 81 mg/dL (ref 65–99)
Potassium: 4.7 mmol/L (ref 3.5–5.3)
Sodium: 139 mmol/L (ref 135–146)
Total Bilirubin: 0.5 mg/dL (ref 0.2–1.2)
Total Protein: 8.1 g/dL (ref 6.1–8.1)

## 2021-08-24 LAB — TSH: TSH: 1.78 mIU/L (ref 0.40–4.50)

## 2021-08-24 NOTE — Assessment & Plan Note (Signed)
Significant hypercalcemia noted on labs also leukocytosis. We will repeat CBC, as well as calcium levels with PTH in a week.

## 2021-08-24 NOTE — Addendum Note (Signed)
Addended by: Monica Becton on: 08/24/2021 09:15 AM   Modules accepted: Orders

## 2021-09-05 LAB — CBC WITH DIFFERENTIAL/PLATELET
Absolute Monocytes: 638 cells/uL (ref 200–950)
Basophils Absolute: 38 cells/uL (ref 0–200)
Basophils Relative: 0.5 %
Eosinophils Absolute: 308 cells/uL (ref 15–500)
Eosinophils Relative: 4.1 %
HCT: 45 % (ref 38.5–50.0)
Hemoglobin: 14.9 g/dL (ref 13.2–17.1)
Lymphs Abs: 2063 cells/uL (ref 850–3900)
MCH: 28.1 pg (ref 27.0–33.0)
MCHC: 33.1 g/dL (ref 32.0–36.0)
MCV: 84.9 fL (ref 80.0–100.0)
MPV: 10.2 fL (ref 7.5–12.5)
Monocytes Relative: 8.5 %
Neutro Abs: 4455 cells/uL (ref 1500–7800)
Neutrophils Relative %: 59.4 %
Platelets: 472 10*3/uL — ABNORMAL HIGH (ref 140–400)
RBC: 5.3 10*6/uL (ref 4.20–5.80)
RDW: 12 % (ref 11.0–15.0)
Total Lymphocyte: 27.5 %
WBC: 7.5 10*3/uL (ref 3.8–10.8)

## 2021-09-05 LAB — PTH, INTACT AND CALCIUM
Calcium: 10.4 mg/dL — ABNORMAL HIGH (ref 8.6–10.3)
PTH: 25 pg/mL (ref 16–77)

## 2021-11-21 ENCOUNTER — Ambulatory Visit: Payer: 59 | Admitting: Sports Medicine

## 2023-03-18 IMAGING — DX DG CHEST 2V
2 series · 2 of 2 positions shown · non-contrast
Comparison: None.

CLINICAL DATA: Shortness of breath the over the last 4 days. Right
upper lobe bronchi.

EXAM:
CHEST - 2 VIEW

[chest pa]
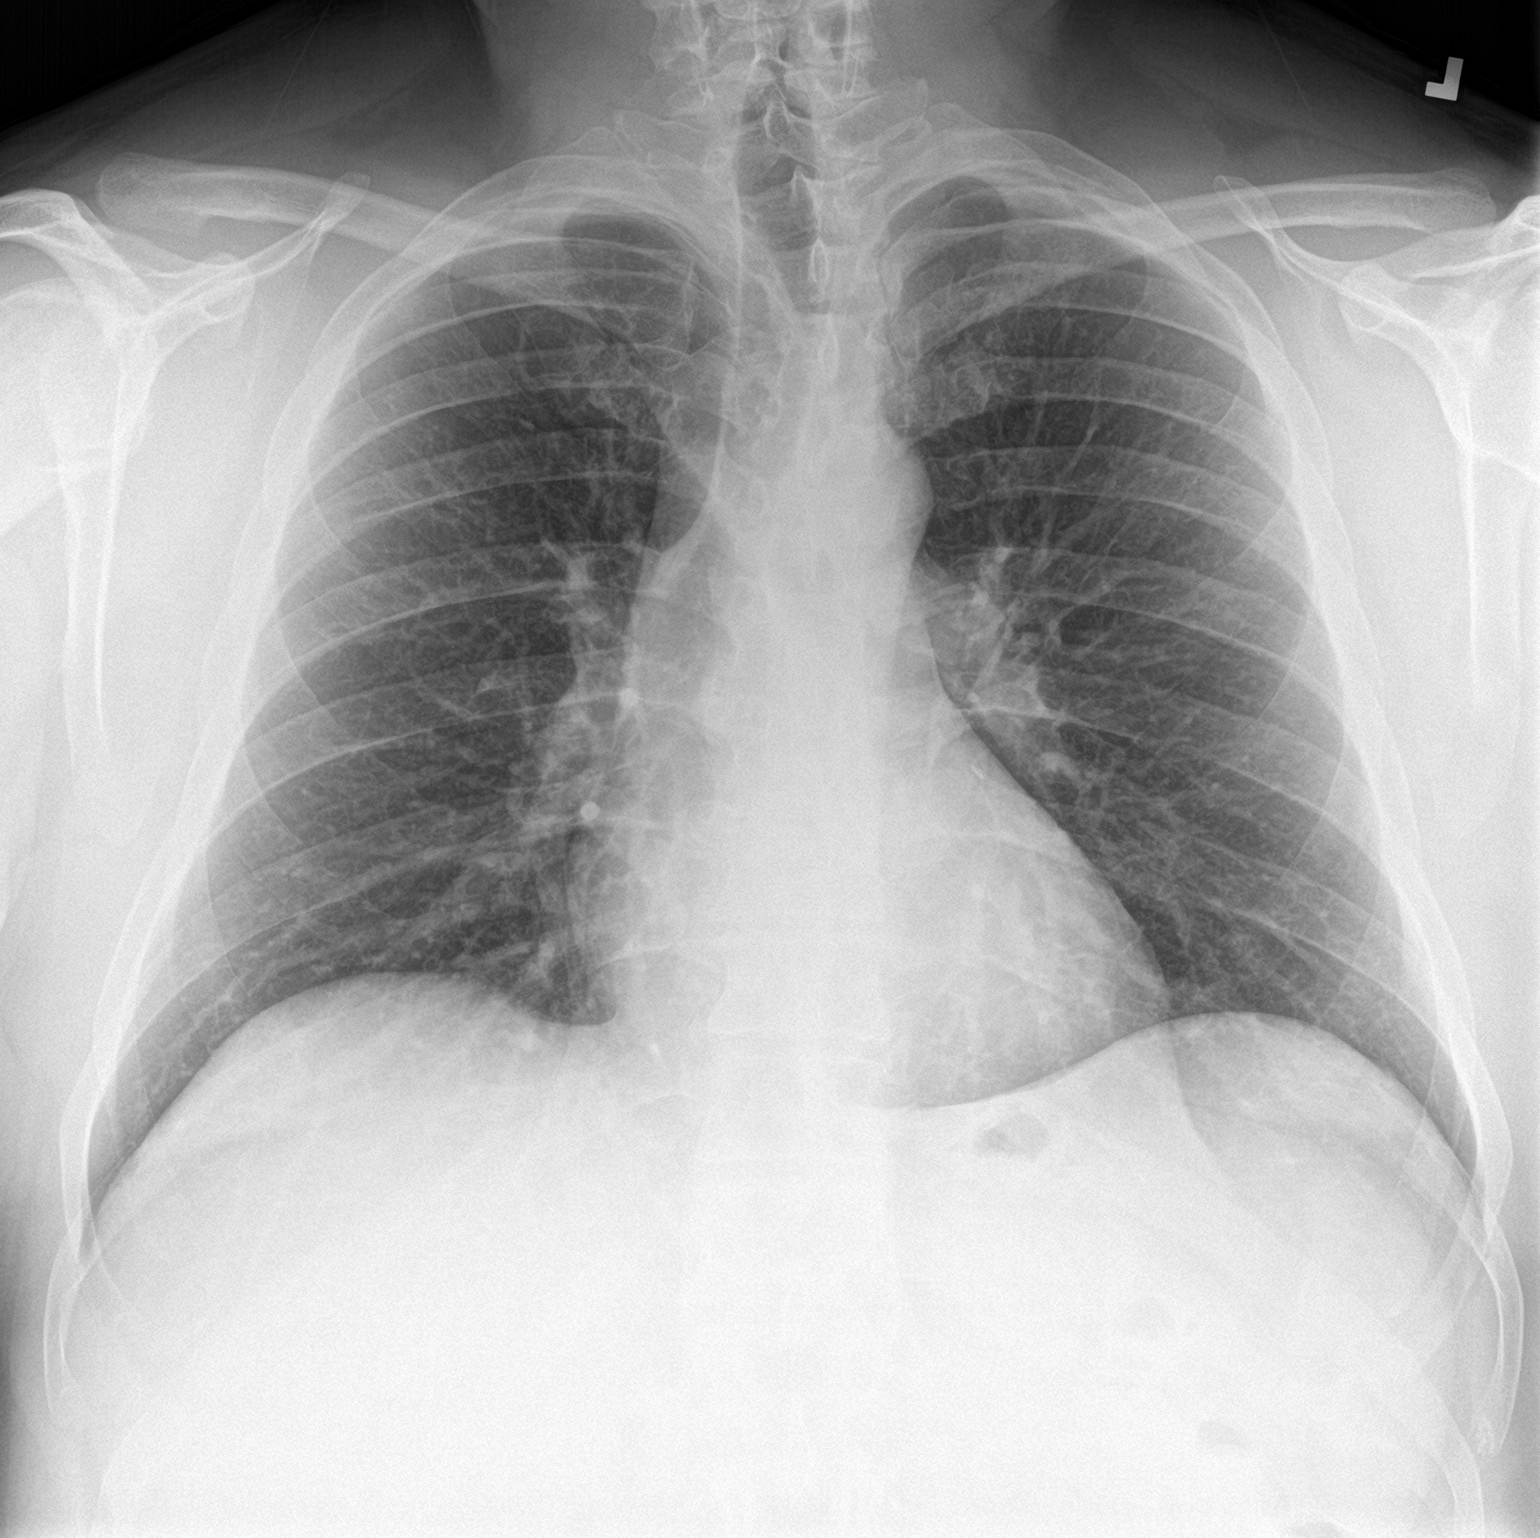

[chest lat]
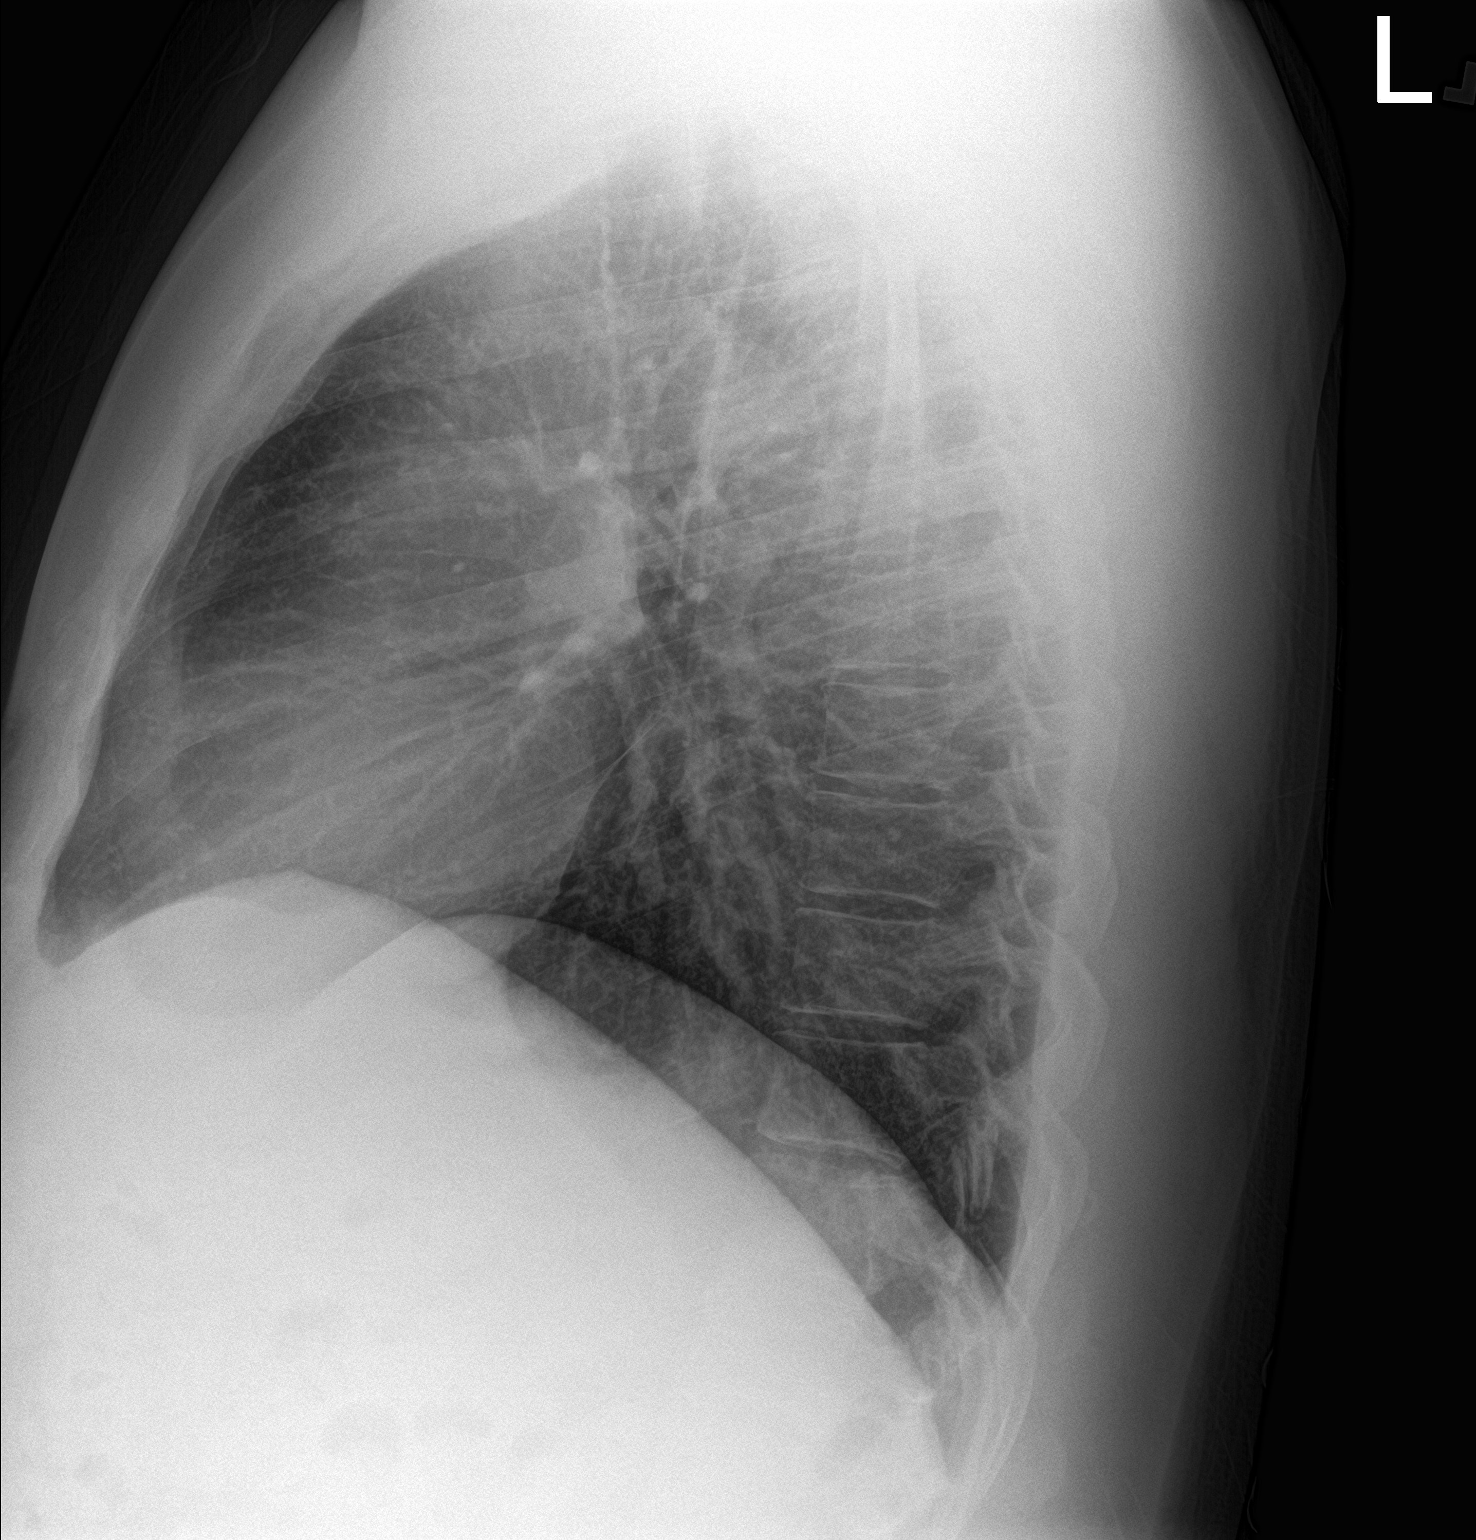

[2 of 2 positions shown; findings below may reference images not displayed]

FINDINGS: Heart size is normal. Mediastinal shadows are normal. The lungs are
clear. No bronchial thickening. No infiltrate, mass, effusion or
collapse. Pulmonary vascularity is normal. No bony abnormality.
IMPRESSION: Normal chest

## 2023-12-10 ENCOUNTER — Ambulatory Visit: Admitting: Sports Medicine

## 2023-12-11 ENCOUNTER — Ambulatory Visit

## 2023-12-11 ENCOUNTER — Encounter: Payer: Self-pay | Admitting: Sports Medicine

## 2023-12-11 ENCOUNTER — Ambulatory Visit: Admitting: Sports Medicine

## 2023-12-11 VITALS — BP 153/95 | HR 94 | Resp 20 | Ht 74.0 in | Wt 293.2 lb

## 2023-12-11 DIAGNOSIS — I10 Essential (primary) hypertension: Secondary | ICD-10-CM | POA: Diagnosis not present

## 2023-12-11 DIAGNOSIS — M26609 Unspecified temporomandibular joint disorder, unspecified side: Secondary | ICD-10-CM | POA: Diagnosis not present

## 2023-12-11 DIAGNOSIS — G4733 Obstructive sleep apnea (adult) (pediatric): Secondary | ICD-10-CM | POA: Insufficient documentation

## 2023-12-11 DIAGNOSIS — G4719 Other hypersomnia: Secondary | ICD-10-CM | POA: Diagnosis not present

## 2023-12-11 DIAGNOSIS — E7841 Elevated Lipoprotein(a): Secondary | ICD-10-CM

## 2023-12-11 NOTE — Assessment & Plan Note (Signed)
 Also having a years long history of discomfort both jaws, sometimes gets stuck, positive mechanical symptoms. I do think we need some x-rays and referral to a TMJ specialist.

## 2023-12-11 NOTE — Assessment & Plan Note (Signed)
 Victor Lee returns, he has not been in here in about 3 years. He has uncontrolled blood pressure, he does admit to some dietary indiscretions. He also tends to snore at night, he has excessive daytime sleepiness and does not wake feeling well rested. I would like him to start with a DASH diet, he will check his blood pressures at home after waiting 15 minutes and we will pull the trigger for home sleep study. Getting some labs as well. I like to see him back after 4 weeks and if blood pressures are not improving we will consider medication.

## 2023-12-11 NOTE — Progress Notes (Signed)
    Procedures performed today:    None.  Independent interpretation of notes and tests performed by another provider:   None.  Brief History, Exam, Impression, and Recommendations:    Benign essential hypertension Victor Lee returns, he has not been in here in about 3 years. He has uncontrolled blood pressure, he does admit to some dietary indiscretions. He also tends to snore at night, he has excessive daytime sleepiness and does not wake feeling well rested. I would like him to start with a DASH diet, he will check his blood pressures at home after waiting 15 minutes and we will pull the trigger for home sleep study. Getting some labs as well. I like to see him back after 4 weeks and if blood pressures are not improving we will consider medication.  Hypercalcemia Recheck calcium and PTH.  Normal PTH, elevated Ca, I'd like endo to weigh in.  Excessive daytime sleepiness Snoring, gasping, not waking feeling well rested, uncontrolled high blood pressure. Ordering home sleep study.  TMJ (temporomandibular joint disorder) Also having a years long history of discomfort both jaws, sometimes gets stuck, positive mechanical symptoms. I do think we need some x-rays and referral to a TMJ specialist.  Hyperlipidemia Low cholesterol diet for 3 months, recheck, if still high will add crestor.    ____________________________________________ Victor Lee. Victor Lee, M.D., ABFM., CAQSM., AME. Primary Care and Sports Medicine [facility]  Adjunct Professor of Family Medicine  [employer]  Restaurant manager, fast food

## 2023-12-11 NOTE — Assessment & Plan Note (Signed)
 Snoring, gasping, not waking feeling well rested, uncontrolled high blood pressure. Ordering home sleep study.

## 2023-12-11 NOTE — Assessment & Plan Note (Signed)
Recheck calcium and PTH

## 2023-12-12 ENCOUNTER — Telehealth: Payer: Self-pay | Admitting: Sports Medicine

## 2023-12-12 LAB — CBC
Hematocrit: 46.7 % (ref 37.5–51.0)
Hemoglobin: 15.6 g/dL (ref 13.0–17.7)
MCH: 28.8 pg (ref 26.6–33.0)
MCHC: 33.4 g/dL (ref 31.5–35.7)
MCV: 86 fL (ref 79–97)
Platelets: 472 10*3/uL — ABNORMAL HIGH (ref 150–450)
RBC: 5.41 x10E6/uL (ref 4.14–5.80)
RDW: 12.6 % (ref 11.6–15.4)
WBC: 9.6 10*3/uL (ref 3.4–10.8)

## 2023-12-12 LAB — COMPREHENSIVE METABOLIC PANEL WITH GFR
ALT: 47 IU/L — ABNORMAL HIGH (ref 0–44)
AST: 26 IU/L (ref 0–40)
Albumin: 4.9 g/dL (ref 4.1–5.1)
Alkaline Phosphatase: 70 IU/L (ref 44–121)
BUN/Creatinine Ratio: 13 (ref 9–20)
BUN: 13 mg/dL (ref 6–24)
Bilirubin Total: 0.4 mg/dL (ref 0.0–1.2)
CO2: 19 mmol/L — ABNORMAL LOW (ref 20–29)
Calcium: 10.4 mg/dL — ABNORMAL HIGH (ref 8.7–10.2)
Chloride: 100 mmol/L (ref 96–106)
Creatinine, Ser: 1 mg/dL (ref 0.76–1.27)
Globulin, Total: 2.7 g/dL (ref 1.5–4.5)
Glucose: 90 mg/dL (ref 70–99)
Potassium: 4.7 mmol/L (ref 3.5–5.2)
Sodium: 139 mmol/L (ref 134–144)
Total Protein: 7.6 g/dL (ref 6.0–8.5)
eGFR: 96 mL/min/{1.73_m2} (ref >59)

## 2023-12-12 LAB — LIPID PANEL
Chol/HDL Ratio: 5.3 ratio — ABNORMAL HIGH (ref 0.0–5.0)
Cholesterol, Total: 211 mg/dL — ABNORMAL HIGH (ref 100–199)
HDL: 40 mg/dL (ref >39)
LDL Chol Calc (NIH): 152 mg/dL — ABNORMAL HIGH (ref 0–99)
Triglycerides: 107 mg/dL (ref 0–149)
VLDL Cholesterol Cal: 19 mg/dL (ref 5–40)

## 2023-12-12 LAB — HEMOGLOBIN A1C
Est. average glucose Bld gHb Est-mCnc: 134 mg/dL
Hgb A1c MFr Bld: 6.3 % — ABNORMAL HIGH (ref 4.8–5.6)

## 2023-12-12 LAB — TSH: TSH: 1.81 u[IU]/mL (ref 0.450–4.500)

## 2023-12-12 LAB — PARATHYROID HORMONE, INTACT (NO CA): PTH: 37 pg/mL (ref 15–65)

## 2023-12-12 NOTE — Assessment & Plan Note (Signed)
 Low cholesterol diet for 3 months, recheck, if still high will add crestor.

## 2023-12-12 NOTE — Addendum Note (Signed)
 Addended by: Monica Becton on: 12/12/2023 04:05 PM   Modules accepted: Orders

## 2023-12-12 NOTE — Telephone Encounter (Signed)
 Copied from CRM [id number]. Topic: Referral - Status >> Dec 12, 2023  8:59 AM Prudencio Pair wrote: Reason for CRM: Maxine Glenn, from Dr. Aletta Edouard office, called stating that they received a referral for a TMJ evaluation for patient. She stated they would have to have a referral from a general dentist in order for them to get the patient scheduled & to see them.

## 2024-01-08 ENCOUNTER — Ambulatory Visit: Admitting: Sports Medicine

## 2024-01-08 VITALS — BP 137/79 | HR 71 | Wt 290.0 lb

## 2024-01-08 DIAGNOSIS — G4719 Other hypersomnia: Secondary | ICD-10-CM

## 2024-01-08 DIAGNOSIS — E7841 Elevated Lipoprotein(a): Secondary | ICD-10-CM | POA: Diagnosis not present

## 2024-01-08 DIAGNOSIS — I10 Essential (primary) hypertension: Secondary | ICD-10-CM

## 2024-01-08 NOTE — Assessment & Plan Note (Signed)
 Dramatic improvements with the DASH diet. He will continue. Still awaiting sleep study.

## 2024-01-08 NOTE — Progress Notes (Signed)
    Procedures performed today:    None.  Independent interpretation of notes and tests performed by another provider:   None.  Brief History, Exam, Impression, and Recommendations:    Benign essential hypertension Dramatic improvements with the DASH diet. He will continue. Still awaiting sleep study.  Excessive daytime sleepiness Gasping, snoring, not waking feeling well rested, high blood pressure, large neck. Never heard back from [facility] so we will switch to a different provider.  Hypercalcemia Elevated calcium on multiple occasions with normal PTH, this can still represent primary hyperparathyroidism, he has not yet heard back from endocrinology so we will place the referral again.  Hyperlipidemia Return in 3 months for fasting lipids.    ____________________________________________ Joselyn Nicely. Sandy Crumb, M.D., ABFM., CAQSM., AME. Primary Care and Sports Medicine [facility]  Adjunct Professor of [facility] Medicine  [employer]  Restaurant manager, fast food

## 2024-01-08 NOTE — Assessment & Plan Note (Signed)
 Return in 3 months for fasting lipids.

## 2024-01-08 NOTE — Assessment & Plan Note (Signed)
 Elevated calcium on multiple occasions with normal PTH, this can still represent primary hyperparathyroidism, he has not yet heard back from endocrinology so we will place the referral again.

## 2024-01-08 NOTE — Assessment & Plan Note (Signed)
 Gasping, snoring, not waking feeling well rested, high blood pressure, large neck. Never heard back from [facility] so we will switch to a different provider.

## 2024-02-04 ENCOUNTER — Ambulatory Visit (HOSPITAL_BASED_OUTPATIENT_CLINIC_OR_DEPARTMENT_OTHER): Attending: Sports Medicine | Admitting: Internal Medicine

## 2024-02-04 DIAGNOSIS — G4733 Obstructive sleep apnea (adult) (pediatric): Secondary | ICD-10-CM | POA: Diagnosis not present

## 2024-02-04 DIAGNOSIS — G4719 Other hypersomnia: Secondary | ICD-10-CM | POA: Diagnosis present

## 2024-02-09 DIAGNOSIS — G4719 Other hypersomnia: Secondary | ICD-10-CM | POA: Diagnosis not present

## 2024-02-09 NOTE — Procedures (Signed)
 [facility] [address] [place], [place] [address] Tel: [phone]   Fax: [phone]  Home Sleep Test Interpretation  Patient Name: Victor Lee, Victor Lee Date: 02/04/2024  Date of Birth: 12-29-1980 Study Type: HST  Age: 43 year MRN #: [id number]  Sex: Male Interpreting Physician: Rosa College [id number]  Height: 6\' 2"  Referring Physician: Dr. Annemarie Kil  Weight: 290.0 lbs Recording Tech: Alvah Auerbach RRT RPSGT RST  BMI: 37.2 Scoring Tech: Alvah Auerbach RRT RPSGT RST  ESS: 7 Neck Size: 18.5   Indications for Polysomnography The patient is a 43 year-old Male who is 6\' 2"  and weighs 290.0 lbs. His BMI equals 37.2.  A home sleep apnea test was performed to evaluate for -osa  Medication  No Data.   Polysomnogram Data A home sleep test recorded the standard physiologic parameters including EKG, nasal and oral airflow.  Respiratory parameters of chest and abdominal movements were recorded with Respiratory Inductance Plethysmography belts.  Oxygen saturation was recorded by pulse oximetry.   Study Architecture The total recording time of the polysomnogram was 375.2 minutes.  The total monitoring time was 375.5 minutes.  Time spent in Supine position was 114.5 minutes.   Respiratory Events The study revealed a presence of 72 obstructive, 69 central, and 1 mixed apneas resulting in an Apnea index of 22.8 events per hour.  There were 57 hypopneas (>=3% desaturation and/or arousal) resulting in an Apnea\Hypopnea Index (AHI >=3% desaturation and/or arousal) of 32.0 events per hour.  There were 45 hypopneas (>=4% desaturation) resulting in an Apnea\Hypopnea Index (AHI >=4% desaturation) of 30.0 events per hour.  There were - Respiratory Effort Related Arousals resulting in a RERA index of - events per hour. The Respiratory Disturbance Index is 32.0 events per hour.  The snore index was - events per hour.  Mean oxygen saturation was 92.7%.   The lowest oxygen saturation during monitoring time was 77.0%.  Time spent <=88% oxygen saturation was 29.6 minutes (7.9%).  Cardiac Summary The average pulse rate was 65.2 bpm.  The minimum pulse rate was 34.0 bpm while the maximum pulse rate was 98.0 bpm.  Cardiac rhythm was normal  Comment: Severe obstructive sleep apnea, AHI (4%) 30/hr. Snoring with oxygen desaturation to a nadir of 77%, mean 92.7%.  Diagnosis: Obstructive sleep apnea  Recommendations: Suggest CPAP titration sleep study or autopap.   This study was personally reviewed and electronically signed by: Dr.Marico Buckle D Gertie Broerman Accredited Board Certified in Sleep Medicine Date/Time: 02/09/24  11:52   Study Overview  Recording Time: 482.7 min. Monitoring Time: 375.5 min.  Analysis Start:  10:17:11 PM Supine Time: 114.5 min.  Analysis Stop:  04:32:21 AM     Study Summary   Count Index Longest Event Duration  Apneas & Hypopneas: 200 32.0  Apneas: 21.2 sec.     Hypopneas: 105.8 sec.  RERAs: - - - sec.  Desaturations: 190 30.4 103.7 sec.  Snores: - - - sec.    Minimum Oxygen Saturation: 77.0%    Respiratory Summary   Total Duration Supine Non-Supine   Count Index Average Longest Count Index Count Index  Obstructive Apnea 72 11.5 15.5 20.8 14 7.3 58 13.3   Mixed Apnea 1 0.2 14.3 14.3 1 0.5 - -   Central Apnea 69 11.0 16.5 21.2 15 7.9 54 12.4   Total Apneas 143 22.8 16.0 21.2 30 15.7 113 26.0            Hypopneas 3% 57 9.1 N.A.  N.A. 23 12.1 34 7.8   Apneas & Hyp. 3% 200 32.0 N.A. N.A. 53 27.8 147 33.8            Hypopneas 4% 45 7.2 N.A. N.A. 18 9.4 27 6.2  Apneas & Hyp. 4% 188 30.0 N.A. N.A. 48 25.2 140 32.2             RERAs - - - - - - - -  RDI 200 32.0 N.A. N.A. 53 27.8 147 33.8   Oxygen Saturation Summary   Total Supine Non-Supine  Average SpO2 92.7% 92.8% 92.7%  Minimum SpO2 77.0% 81.0% 77.0%   Maximum SpO2 98.0% 98.0% 98.0%   Oxygen Saturation Distribution  Range (%) Time in range (min) Time in  range (%)  90.0 - 100.0 330.1 87.9%  80.0 - 90.0 44.0 11.7%  70.0 - 80.0 1.3 0.3%  60.0 - 70.0 - -  50.0 - 60.0 - -  0.0 - 50.0 - -  Time Spent <=88% SpO2  Range (%) Time in range (min) Time in range (%)  0.0 - 88.0 29.6 7.9%  Cardiac Summary   Total Supine Non-Supine  Average Pulse Rate (BPM) 65.2 62.5 66.4  Minimum Pulse Rate (BPM) 34.0 34.0 44.0  Maximum Pulse Rate (BPM) 98.0 98.0 98.0                      Technologist Comments  -                         Jolee Naval, Biomedical engineer of Sleep Medicine  ELECTRONICALLY SIGNED ON:  02/09/2024, 11:48 AM [facility] PH: [phone]   FX: [phone] ACCREDITED BY THE [employer]ICAN ACADEMY OF SLEEP MEDICINE

## 2024-02-10 ENCOUNTER — Encounter: Payer: Self-pay | Admitting: Sports Medicine

## 2024-02-10 ENCOUNTER — Other Ambulatory Visit: Payer: Self-pay | Admitting: Sports Medicine

## 2024-02-10 DIAGNOSIS — G4733 Obstructive sleep apnea (adult) (pediatric): Secondary | ICD-10-CM

## 2024-02-15 ENCOUNTER — Telehealth: Payer: Self-pay

## 2024-02-15 DIAGNOSIS — G4733 Obstructive sleep apnea (adult) (pediatric): Secondary | ICD-10-CM

## 2024-02-15 NOTE — Telephone Encounter (Signed)
 Copied from CRM [id number]. Topic: Clinical - Request for Lab/Test Order >> Feb 15, 2024 11:43 AM Lenon Radar A wrote: Reason for CRM:  [facility] called in requesting Cpap order for patient. Please send order through Epic for patient. If any question please contact [phone].   I didn't see a completed order for CPAP. Can you look into this please and send it to [facility] through EPIC.

## 2024-02-16 MED ORDER — AMBULATORY NON FORMULARY MEDICATION: Status: AC

## 2024-02-16 NOTE — Telephone Encounter (Signed)
 It was included in the referral, I added the order for CPAP with [facility], I can place a separate prescription that can be printed out.

## 2024-02-16 NOTE — Addendum Note (Signed)
 Addended by: Gean Keels on: 02/16/2024 10:27 AM   Modules accepted: Orders

## 2024-02-18 ENCOUNTER — Encounter (INDEPENDENT_AMBULATORY_CARE_PROVIDER_SITE_OTHER): Payer: Self-pay | Admitting: Sports Medicine

## 2024-02-18 DIAGNOSIS — G4733 Obstructive sleep apnea (adult) (pediatric): Secondary | ICD-10-CM | POA: Diagnosis not present

## 2024-02-18 NOTE — Telephone Encounter (Signed)
 Faxed order to [facility]!

## 2024-02-19 NOTE — Telephone Encounter (Signed)

## 2024-02-19 NOTE — Telephone Encounter (Signed)
 Home sleep test results in chart-

## 2024-04-09 ENCOUNTER — Ambulatory Visit: Admitting: Sports Medicine

## 2024-05-06 ENCOUNTER — Encounter: Payer: Self-pay | Admitting: Sports Medicine
